# Patient Record
Sex: Female | Born: 1985 | Race: Black or African American | Hispanic: No | Marital: Single | Smoking: Never smoker
Health system: Southern US, Community
[De-identification: ages and names within clinical notes are randomized; demographics above are authoritative.]

## PROBLEM LIST (undated history)

## (undated) DIAGNOSIS — C8590 Non-Hodgkin lymphoma, unspecified, unspecified site: Secondary | ICD-10-CM

## (undated) DIAGNOSIS — T7840XA Allergy, unspecified, initial encounter: Secondary | ICD-10-CM

## (undated) DIAGNOSIS — D573 Sickle-cell trait: Secondary | ICD-10-CM

## (undated) DIAGNOSIS — D649 Anemia, unspecified: Secondary | ICD-10-CM

## (undated) DIAGNOSIS — C801 Malignant (primary) neoplasm, unspecified: Secondary | ICD-10-CM

## (undated) DIAGNOSIS — C8190 Hodgkin lymphoma, unspecified, unspecified site: Secondary | ICD-10-CM

## (undated) DIAGNOSIS — E039 Hypothyroidism, unspecified: Secondary | ICD-10-CM

## (undated) DIAGNOSIS — T783XXA Angioneurotic edema, initial encounter: Secondary | ICD-10-CM

## (undated) DIAGNOSIS — I10 Essential (primary) hypertension: Secondary | ICD-10-CM

## (undated) HISTORY — DX: Non-Hodgkin lymphoma, unspecified, unspecified site: C85.90

## (undated) HISTORY — DX: Anemia, unspecified: D64.9

## (undated) HISTORY — PX: BREAST REDUCTION SURGERY: SHX8

## (undated) HISTORY — DX: Malignant (primary) neoplasm, unspecified: C80.1

## (undated) HISTORY — PX: COSMETIC SURGERY: SHX468

## (undated) HISTORY — DX: Angioneurotic edema, initial encounter: T78.3XXA

## (undated) HISTORY — DX: Allergy, unspecified, initial encounter: T78.40XA

## (undated) HISTORY — DX: Hypothyroidism, unspecified: E03.9

## (undated) HISTORY — PX: REDUCTION MAMMAPLASTY: SUR839

---

## 2003-03-16 HISTORY — PX: LYMPH NODE BIOPSY: SHX201

## 2004-02-12 ENCOUNTER — Emergency Department (HOSPITAL_COMMUNITY): Admission: EM | Admit: 2004-02-12 | Discharge: 2004-02-12 | Payer: Self-pay | Admitting: Emergency Medicine

## 2004-02-25 ENCOUNTER — Encounter: Admission: RE | Admit: 2004-02-25 | Discharge: 2004-02-25 | Payer: Self-pay | Admitting: Otolaryngology

## 2004-03-06 ENCOUNTER — Ambulatory Visit (HOSPITAL_COMMUNITY): Admission: RE | Admit: 2004-03-06 | Discharge: 2004-03-06 | Payer: Self-pay | Admitting: Otolaryngology

## 2004-03-06 ENCOUNTER — Ambulatory Visit (HOSPITAL_BASED_OUTPATIENT_CLINIC_OR_DEPARTMENT_OTHER): Admission: RE | Admit: 2004-03-06 | Discharge: 2004-03-06 | Payer: Self-pay | Admitting: Otolaryngology

## 2004-03-06 ENCOUNTER — Encounter (INDEPENDENT_AMBULATORY_CARE_PROVIDER_SITE_OTHER): Payer: Self-pay | Admitting: *Deleted

## 2004-03-06 ENCOUNTER — Encounter (INDEPENDENT_AMBULATORY_CARE_PROVIDER_SITE_OTHER): Payer: Self-pay | Admitting: Otolaryngology

## 2004-03-13 ENCOUNTER — Ambulatory Visit: Payer: Self-pay | Admitting: Oncology

## 2004-03-15 DIAGNOSIS — C8190 Hodgkin lymphoma, unspecified, unspecified site: Secondary | ICD-10-CM

## 2004-03-15 HISTORY — DX: Hodgkin lymphoma, unspecified, unspecified site: C81.90

## 2004-03-15 HISTORY — PX: PORTACATH PLACEMENT: SHX2246

## 2004-03-20 ENCOUNTER — Ambulatory Visit (HOSPITAL_COMMUNITY): Admission: RE | Admit: 2004-03-20 | Discharge: 2004-03-20 | Payer: Self-pay | Admitting: Oncology

## 2004-03-24 ENCOUNTER — Ambulatory Visit: Admission: RE | Admit: 2004-03-24 | Discharge: 2004-03-24 | Payer: Self-pay | Admitting: Oncology

## 2004-03-24 ENCOUNTER — Encounter (INDEPENDENT_AMBULATORY_CARE_PROVIDER_SITE_OTHER): Payer: Self-pay | Admitting: Cardiology

## 2004-03-31 ENCOUNTER — Ambulatory Visit (HOSPITAL_COMMUNITY): Admission: RE | Admit: 2004-03-31 | Discharge: 2004-03-31 | Payer: Self-pay | Admitting: Oncology

## 2004-04-07 ENCOUNTER — Ambulatory Visit: Admission: RE | Admit: 2004-04-07 | Discharge: 2004-05-18 | Payer: Self-pay | Admitting: *Deleted

## 2004-04-30 ENCOUNTER — Ambulatory Visit: Payer: Self-pay | Admitting: Oncology

## 2004-06-16 ENCOUNTER — Ambulatory Visit (HOSPITAL_COMMUNITY): Admission: RE | Admit: 2004-06-16 | Discharge: 2004-06-16 | Payer: Self-pay | Admitting: Oncology

## 2004-06-17 ENCOUNTER — Ambulatory Visit: Payer: Self-pay | Admitting: Oncology

## 2004-07-17 ENCOUNTER — Ambulatory Visit: Payer: Self-pay | Admitting: Oncology

## 2004-07-21 ENCOUNTER — Ambulatory Visit: Admission: RE | Admit: 2004-07-21 | Discharge: 2004-10-09 | Payer: Self-pay | Admitting: *Deleted

## 2004-08-27 ENCOUNTER — Ambulatory Visit: Payer: Self-pay | Admitting: Oncology

## 2004-09-16 ENCOUNTER — Ambulatory Visit: Payer: Self-pay | Admitting: Oncology

## 2004-09-21 ENCOUNTER — Ambulatory Visit (HOSPITAL_COMMUNITY): Admission: RE | Admit: 2004-09-21 | Discharge: 2004-09-21 | Payer: Self-pay | Admitting: Oncology

## 2004-11-06 ENCOUNTER — Ambulatory Visit: Payer: Self-pay | Admitting: Oncology

## 2004-11-09 ENCOUNTER — Ambulatory Visit (HOSPITAL_COMMUNITY): Admission: RE | Admit: 2004-11-09 | Discharge: 2004-11-09 | Payer: Self-pay | Admitting: Oncology

## 2005-02-23 ENCOUNTER — Ambulatory Visit: Payer: Self-pay | Admitting: Oncology

## 2005-03-01 ENCOUNTER — Ambulatory Visit (HOSPITAL_COMMUNITY): Admission: RE | Admit: 2005-03-01 | Discharge: 2005-03-01 | Payer: Self-pay | Admitting: Oncology

## 2005-05-31 ENCOUNTER — Ambulatory Visit: Payer: Self-pay | Admitting: Oncology

## 2005-08-17 ENCOUNTER — Ambulatory Visit (HOSPITAL_COMMUNITY): Admission: RE | Admit: 2005-08-17 | Discharge: 2005-08-17 | Payer: Self-pay | Admitting: Oncology

## 2005-08-23 ENCOUNTER — Ambulatory Visit (HOSPITAL_COMMUNITY): Admission: RE | Admit: 2005-08-23 | Discharge: 2005-08-23 | Payer: Self-pay | Admitting: Oncology

## 2005-08-24 ENCOUNTER — Ambulatory Visit: Payer: Self-pay | Admitting: Oncology

## 2005-08-25 LAB — COMPREHENSIVE METABOLIC PANEL
AST: 19 U/L (ref 0–37)
Albumin: 3.8 g/dL (ref 3.5–5.2)
Alkaline Phosphatase: 51 U/L (ref 39–117)
BUN: 7 mg/dL (ref 6–23)
Calcium: 9.7 mg/dL (ref 8.4–10.5)
Chloride: 104 mEq/L (ref 96–112)
Glucose, Bld: 108 mg/dL — ABNORMAL HIGH (ref 70–99)
Potassium: 3.6 mEq/L (ref 3.5–5.3)
Sodium: 138 mEq/L (ref 135–145)
Total Protein: 7.2 g/dL (ref 6.0–8.3)

## 2005-08-25 LAB — CBC WITH DIFFERENTIAL (CANCER CENTER ONLY)
BASO%: 0.7 % (ref 0.0–2.0)
EOS%: 6.8 % (ref 0.0–7.0)
HCT: 36.3 % (ref 34.8–46.6)
LYMPH%: 33.1 % (ref 14.0–48.0)
MCH: 25.2 pg — ABNORMAL LOW (ref 26.0–34.0)
MCHC: 31.7 g/dL — ABNORMAL LOW (ref 32.0–36.0)
MCV: 80 fL — ABNORMAL LOW (ref 81–101)
MONO%: 5.7 % (ref 0.0–13.0)
NEUT%: 53.7 % (ref 39.6–80.0)
RDW: 13.3 % (ref 10.5–14.6)

## 2005-08-25 LAB — SEDIMENTATION RATE: Sed Rate: 11 mm/hr (ref 0–22)

## 2005-11-11 ENCOUNTER — Ambulatory Visit: Payer: Self-pay | Admitting: Oncology

## 2005-12-23 ENCOUNTER — Inpatient Hospital Stay (HOSPITAL_COMMUNITY): Admission: AD | Admit: 2005-12-23 | Discharge: 2005-12-23 | Payer: Self-pay | Admitting: Obstetrics and Gynecology

## 2005-12-24 ENCOUNTER — Ambulatory Visit: Payer: Self-pay | Admitting: Obstetrics & Gynecology

## 2005-12-24 ENCOUNTER — Encounter (INDEPENDENT_AMBULATORY_CARE_PROVIDER_SITE_OTHER): Payer: Self-pay | Admitting: Specialist

## 2005-12-24 ENCOUNTER — Ambulatory Visit (HOSPITAL_COMMUNITY): Admission: RE | Admit: 2005-12-24 | Discharge: 2005-12-24 | Payer: Self-pay | Admitting: Obstetrics & Gynecology

## 2006-01-07 ENCOUNTER — Ambulatory Visit: Payer: Self-pay | Admitting: Gynecology

## 2006-02-01 ENCOUNTER — Emergency Department (HOSPITAL_COMMUNITY): Admission: EM | Admit: 2006-02-01 | Discharge: 2006-02-01 | Payer: Self-pay | Admitting: Emergency Medicine

## 2006-02-22 ENCOUNTER — Ambulatory Visit (HOSPITAL_COMMUNITY): Admission: RE | Admit: 2006-02-22 | Discharge: 2006-02-22 | Payer: Self-pay | Admitting: Oncology

## 2006-03-02 ENCOUNTER — Ambulatory Visit: Payer: Self-pay | Admitting: Oncology

## 2006-03-03 LAB — CBC WITH DIFFERENTIAL (CANCER CENTER ONLY)
Eosinophils Absolute: 0.1 10*3/uL (ref 0.0–0.5)
HCT: 34.8 % (ref 34.8–46.6)
LYMPH%: 35.4 % (ref 14.0–48.0)
MCH: 26.6 pg (ref 26.0–34.0)
MCV: 81 fL (ref 81–101)
MONO#: 0.3 10*3/uL (ref 0.1–0.9)
NEUT%: 53.6 % (ref 39.6–80.0)
RBC: 4.29 10*6/uL (ref 3.70–5.32)
WBC: 4 10*3/uL (ref 3.9–10.0)

## 2006-03-03 LAB — COMPREHENSIVE METABOLIC PANEL
AST: 20 U/L (ref 0–37)
Alkaline Phosphatase: 48 U/L (ref 39–117)
BUN: 6 mg/dL (ref 6–23)
Creatinine, Ser: 0.6 mg/dL (ref 0.40–1.20)
Glucose, Bld: 86 mg/dL (ref 70–99)

## 2006-04-15 ENCOUNTER — Ambulatory Visit: Payer: Self-pay | Admitting: Oncology

## 2006-04-18 ENCOUNTER — Encounter (INDEPENDENT_AMBULATORY_CARE_PROVIDER_SITE_OTHER): Payer: Self-pay | Admitting: *Deleted

## 2006-04-18 ENCOUNTER — Ambulatory Visit (HOSPITAL_COMMUNITY): Admission: RE | Admit: 2006-04-18 | Discharge: 2006-04-18 | Payer: Self-pay | Admitting: General Surgery

## 2006-04-19 LAB — CBC WITH DIFFERENTIAL (CANCER CENTER ONLY)
BASO%: 0.3 % (ref 0.0–2.0)
EOS%: 2.9 % (ref 0.0–7.0)
HCT: 34.8 % (ref 34.8–46.6)
LYMPH#: 1.3 10*3/uL (ref 0.9–3.3)
MCHC: 33 g/dL (ref 32.0–36.0)
NEUT#: 3.1 10*3/uL (ref 1.5–6.5)
NEUT%: 62.2 % (ref 39.6–80.0)
RDW: 13.4 % (ref 10.5–14.6)

## 2006-10-14 ENCOUNTER — Ambulatory Visit: Payer: Self-pay | Admitting: Oncology

## 2006-10-20 ENCOUNTER — Ambulatory Visit (HOSPITAL_COMMUNITY): Admission: RE | Admit: 2006-10-20 | Discharge: 2006-10-20 | Payer: Self-pay | Admitting: Oncology

## 2006-10-20 LAB — COMPREHENSIVE METABOLIC PANEL
Alkaline Phosphatase: 49 U/L (ref 39–117)
CO2: 25 mEq/L (ref 19–32)
Creatinine, Ser: 0.71 mg/dL (ref 0.40–1.20)
Glucose, Bld: 120 mg/dL — ABNORMAL HIGH (ref 70–99)
Sodium: 139 mEq/L (ref 135–145)
Total Bilirubin: 0.4 mg/dL (ref 0.3–1.2)
Total Protein: 7.1 g/dL (ref 6.0–8.3)

## 2006-10-20 LAB — CBC WITH DIFFERENTIAL (CANCER CENTER ONLY)
BASO#: 0 10*3/uL (ref 0.0–0.2)
BASO%: 0.3 % (ref 0.0–2.0)
EOS%: 3.8 % (ref 0.0–7.0)
HGB: 11.2 g/dL — ABNORMAL LOW (ref 11.6–15.9)
LYMPH#: 1.9 10*3/uL (ref 0.9–3.3)
MCHC: 32.7 g/dL (ref 32.0–36.0)
NEUT#: 2.1 10*3/uL (ref 1.5–6.5)
RDW: 14.3 % (ref 10.5–14.6)

## 2006-10-20 LAB — SEDIMENTATION RATE: Sed Rate: 8 mm/hr (ref 0–22)

## 2006-10-26 LAB — IRON AND TIBC
%SAT: 21 % (ref 20–55)
Iron: 79 ug/dL (ref 42–145)
TIBC: 368 ug/dL (ref 250–470)

## 2006-10-26 LAB — FERRITIN: Ferritin: 7 ng/mL — ABNORMAL LOW (ref 10–291)

## 2007-02-22 ENCOUNTER — Inpatient Hospital Stay (HOSPITAL_COMMUNITY): Admission: AD | Admit: 2007-02-22 | Discharge: 2007-02-23 | Payer: Self-pay | Admitting: Obstetrics and Gynecology

## 2007-03-23 ENCOUNTER — Ambulatory Visit (HOSPITAL_COMMUNITY): Admission: RE | Admit: 2007-03-23 | Discharge: 2007-03-23 | Payer: Self-pay | Admitting: Family Medicine

## 2007-04-20 ENCOUNTER — Ambulatory Visit (HOSPITAL_COMMUNITY): Admission: RE | Admit: 2007-04-20 | Discharge: 2007-04-20 | Payer: Self-pay | Admitting: Family Medicine

## 2007-04-24 ENCOUNTER — Ambulatory Visit: Payer: Self-pay | Admitting: Oncology

## 2007-05-04 ENCOUNTER — Ambulatory Visit (HOSPITAL_COMMUNITY): Admission: RE | Admit: 2007-05-04 | Discharge: 2007-05-04 | Payer: Self-pay | Admitting: Family Medicine

## 2007-06-01 ENCOUNTER — Ambulatory Visit (HOSPITAL_COMMUNITY): Admission: RE | Admit: 2007-06-01 | Discharge: 2007-06-01 | Payer: Self-pay | Admitting: Family Medicine

## 2007-06-29 ENCOUNTER — Ambulatory Visit (HOSPITAL_COMMUNITY): Admission: RE | Admit: 2007-06-29 | Discharge: 2007-06-29 | Payer: Self-pay | Admitting: Family Medicine

## 2007-07-24 ENCOUNTER — Inpatient Hospital Stay (HOSPITAL_COMMUNITY): Admission: AD | Admit: 2007-07-24 | Discharge: 2007-07-24 | Payer: Self-pay | Admitting: Family Medicine

## 2007-08-09 ENCOUNTER — Encounter: Admission: RE | Admit: 2007-08-09 | Discharge: 2007-08-09 | Payer: Self-pay | Admitting: Obstetrics & Gynecology

## 2007-08-10 ENCOUNTER — Ambulatory Visit (HOSPITAL_COMMUNITY): Admission: RE | Admit: 2007-08-10 | Discharge: 2007-08-10 | Payer: Self-pay | Admitting: Family Medicine

## 2007-09-07 ENCOUNTER — Ambulatory Visit (HOSPITAL_COMMUNITY): Admission: RE | Admit: 2007-09-07 | Discharge: 2007-09-07 | Payer: Self-pay | Admitting: Family Medicine

## 2007-09-29 ENCOUNTER — Inpatient Hospital Stay (HOSPITAL_COMMUNITY): Admission: AD | Admit: 2007-09-29 | Discharge: 2007-10-02 | Payer: Self-pay | Admitting: Obstetrics & Gynecology

## 2007-10-19 ENCOUNTER — Ambulatory Visit: Payer: Self-pay | Admitting: Oncology

## 2007-11-14 LAB — CBC WITH DIFFERENTIAL (CANCER CENTER ONLY)
BASO#: 0 10*3/uL (ref 0.0–0.2)
Eosinophils Absolute: 0.2 10*3/uL (ref 0.0–0.5)
HGB: 12 g/dL (ref 11.6–15.9)
LYMPH%: 40.1 % (ref 14.0–48.0)
MCV: 75 fL — ABNORMAL LOW (ref 81–101)
MONO#: 0.3 10*3/uL (ref 0.1–0.9)
NEUT#: 1.6 10*3/uL (ref 1.5–6.5)
Platelets: 257 10*3/uL (ref 145–400)
RBC: 4.91 10*6/uL (ref 3.70–5.32)
WBC: 3.5 10*3/uL — ABNORMAL LOW (ref 3.9–10.0)

## 2007-11-14 LAB — COMPREHENSIVE METABOLIC PANEL
ALT: 27 U/L (ref 0–35)
BUN: 9 mg/dL (ref 6–23)
CO2: 24 mEq/L (ref 19–32)
Calcium: 9.2 mg/dL (ref 8.4–10.5)
Creatinine, Ser: 0.7 mg/dL (ref 0.40–1.20)
Glucose, Bld: 88 mg/dL (ref 70–99)
Total Bilirubin: 0.4 mg/dL (ref 0.3–1.2)

## 2007-11-14 LAB — IRON AND TIBC
%SAT: 13 % — ABNORMAL LOW (ref 20–55)
Iron: 48 ug/dL (ref 42–145)
TIBC: 378 ug/dL (ref 250–470)
UIBC: 330 ug/dL

## 2007-11-14 LAB — SEDIMENTATION RATE: Sed Rate: 6 mm/hr (ref 0–22)

## 2008-05-03 ENCOUNTER — Ambulatory Visit: Payer: Self-pay | Admitting: Oncology

## 2008-05-08 ENCOUNTER — Ambulatory Visit (HOSPITAL_COMMUNITY): Admission: RE | Admit: 2008-05-08 | Discharge: 2008-05-08 | Payer: Self-pay | Admitting: Oncology

## 2008-05-13 LAB — CMP (CANCER CENTER ONLY)
ALT(SGPT): 17 U/L (ref 10–47)
AST: 24 U/L (ref 11–38)
CO2: 28 mEq/L (ref 18–33)
Sodium: 137 mEq/L (ref 128–145)
Total Bilirubin: 0.5 mg/dl (ref 0.20–1.60)
Total Protein: 7.7 g/dL (ref 6.4–8.1)

## 2008-05-13 LAB — CBC WITH DIFFERENTIAL (CANCER CENTER ONLY)
BASO#: 0 10*3/uL (ref 0.0–0.2)
Eosinophils Absolute: 0.2 10*3/uL (ref 0.0–0.5)
HGB: 11.9 g/dL (ref 11.6–15.9)
LYMPH#: 1.5 10*3/uL (ref 0.9–3.3)
MCH: 25.4 pg — ABNORMAL LOW (ref 26.0–34.0)
MONO#: 0.3 10*3/uL (ref 0.1–0.9)
MONO%: 8.3 % (ref 0.0–13.0)
NEUT#: 1.3 10*3/uL — ABNORMAL LOW (ref 1.5–6.5)
Platelets: 237 10*3/uL (ref 145–400)
RBC: 4.67 10*6/uL (ref 3.70–5.32)
WBC: 3.4 10*3/uL — ABNORMAL LOW (ref 3.9–10.0)

## 2008-05-14 LAB — SEDIMENTATION RATE: Sed Rate: 9 mm/hr (ref 0–22)

## 2008-11-05 ENCOUNTER — Ambulatory Visit: Payer: Self-pay | Admitting: Oncology

## 2008-11-08 LAB — CBC WITH DIFFERENTIAL (CANCER CENTER ONLY)
BASO%: 0.7 % (ref 0.0–2.0)
EOS%: 7.7 % — ABNORMAL HIGH (ref 0.0–7.0)
HGB: 12.2 g/dL (ref 11.6–15.9)
LYMPH#: 1.6 10*3/uL (ref 0.9–3.3)
MCH: 26.2 pg (ref 26.0–34.0)
MCHC: 33.6 g/dL (ref 32.0–36.0)
MONO%: 7.4 % (ref 0.0–13.0)
NEUT#: 1.5 10*3/uL (ref 1.5–6.5)
Platelets: 231 10*3/uL (ref 145–400)
RDW: 13.7 % (ref 10.5–14.6)

## 2008-11-08 LAB — CMP (CANCER CENTER ONLY)
CO2: 29 mEq/L (ref 18–33)
Calcium: 8.9 mg/dL (ref 8.0–10.3)
Creat: 0.7 mg/dl (ref 0.6–1.2)
Glucose, Bld: 94 mg/dL (ref 73–118)
Sodium: 142 mEq/L (ref 128–145)
Total Bilirubin: 0.5 mg/dl (ref 0.20–1.60)
Total Protein: 7.9 g/dL (ref 6.4–8.1)

## 2008-11-08 LAB — SEDIMENTATION RATE: Sed Rate: 8 mm/hr (ref 0–22)

## 2009-04-02 ENCOUNTER — Emergency Department (HOSPITAL_COMMUNITY): Admission: EM | Admit: 2009-04-02 | Discharge: 2009-04-02 | Payer: Self-pay | Admitting: Emergency Medicine

## 2009-05-09 ENCOUNTER — Ambulatory Visit: Payer: Self-pay | Admitting: Oncology

## 2009-05-13 LAB — CMP (CANCER CENTER ONLY)
ALT(SGPT): 26 U/L (ref 10–47)
Alkaline Phosphatase: 58 U/L (ref 26–84)
Sodium: 137 mEq/L (ref 128–145)
Total Bilirubin: 0.6 mg/dl (ref 0.20–1.60)
Total Protein: 7.4 g/dL (ref 6.4–8.1)

## 2009-05-13 LAB — CBC WITH DIFFERENTIAL (CANCER CENTER ONLY)
BASO%: 0.3 % (ref 0.0–2.0)
LYMPH%: 41.9 % (ref 14.0–48.0)
MCH: 25.1 pg — ABNORMAL LOW (ref 26.0–34.0)
MCV: 77 fL — ABNORMAL LOW (ref 81–101)
MONO%: 6.5 % (ref 0.0–13.0)
Platelets: 236 10*3/uL (ref 145–400)
RDW: 13.8 % (ref 10.5–14.6)

## 2010-04-05 ENCOUNTER — Encounter: Payer: Self-pay | Admitting: Oncology

## 2010-07-31 NOTE — Op Note (Signed)
[name], Katelyn Cobb               ACCOUNT NO.:  [id number]   MEDICAL RECORD NO.:  [id number]          PATIENT TYPE:  AMB   LOCATION:  DAY                          FACILITY:  [facility]   PHYSICIAN:  Leonie Man, M.D.   DATE OF BIRTH:  10/15/1985   DATE OF PROCEDURE:  04/18/2006  DATE OF DISCHARGE:                               OPERATIVE REPORT   PREOPERATIVE DIAGNOSIS:  Left inguinal adenopathy.   POSTOPERATIVE DIAGNOSIS:  Left inguinal adenopathy.   PROCEDURE:  Is excisional biopsy of left inguinal lymph node.   SURGEON:  Dr. Lurene Shadow   ASSISTANT:  OR nurse.   ANESTHESIA:  General.   HISTORY:  Katelyn Cobb is a 25 year old female status post treatment for  Hodgkin's disease in the past, now comes back with adenopathy and has a  large left-sided groin lymph node. The question is whether or not she  has recurrent Hodgkin's.  She comes to the operating room for excisional  biopsy of this lymph node after the risks and potential benefits of  surgery have been discussed.  All questions answered and consent  obtained.   PROCEDURE:  The patient positioned supinely and following satisfactory  sedation the groin is prepped and draped to be included in a sterile  operative field.  The region of the lymph node then is incised over and  deepened through the skin and subcutaneous tissue carrying the  dissection down to the lymph node which was at the edge of the inguinal  canal. This was dissected free on all sides, removed in it's entirety  and forwarded for pathologic evaluation.  Hemostasis assured with  electrocautery.  Sponge and instrument counts verified.  Subcutaneous  tissues closed with 3-0 Vicryl sutures.  Skin closed with 4-0 Monocryl  suture and reinforced with Steri-Strips.  A sterile dressing applied.  Anesthetic reversed.  The patient removed from the operating room to the  recovery room in stable condition.  She tolerated the procedure well.      Leonie Man, M.D.  Electronically Signed     PB/MEDQ  D:  04/18/2006  T:  04/18/2006  Job:  [id number]   cc:   Drue Second, MD  Fax: [phone]

## 2010-07-31 NOTE — Op Note (Signed)
NAMEJILENE, SPOHR               ACCOUNT NO.:  [id number]   MEDICAL RECORD NO.:  [id number]          PATIENT TYPE:  AMB   LOCATION:  DSC                          FACILITY:  [facility]   PHYSICIAN:  Christopher E. Ezzard Standing, M.D.DATE OF BIRTH:  01-20-1986   DATE OF PROCEDURE:  03/06/2004  DATE OF DISCHARGE:                                 OPERATIVE REPORT   PREOPERATIVE DIAGNOSIS:  Multiple left neck lymphadenopathy.   POSTOPERATIVE DIAGNOSES:  1.  Multiple left neck lymphadenopathy.  2.  Rule out lymphoma.   OPERATION:  Excisional biopsy of deep left neck lymph nodes x2.   SURGEON:  Kristine Garbe. Ezzard Standing, M.D.   ANESTHESIA:  General endotracheal.   COMPLICATIONS:  None.   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  On examination she has several  large left lower neck lymph nodes.  A CT scan showed fairly extensive  lymphadenopathy extending down to the upper mediastinum and predominantly  more on the left side.  She was taken to the operating room at this time for  excisional biopsy of a left neck lymph node.   DESCRIPTION OF PROCEDURE:  After general endotracheal anesthesia, the  patient received 1 g Ancef IV preoperatively.  The left neck was prepped and  draped Betadine solution and draped out with sterile towels.  A 3-4 cm  incision was made horizontally just lateral to the sternocleidomastoid  muscle just above the clavicle on the left lower neck.  Dissection was  carried down deep to the platysma muscle down into the supraclavicular fat  pad and lymph nodes.  She had multiple moderate-sized lymph nodes, 1-2 cm  size.  She had some larger nodes a little bit more medially and inferior,  but these would be much more difficult to remove.  The first node removed  was sent fresh for frozen section.  Frozen section report was consistent  with possible lymphoma, and the pathologist requested more tissue.  A  second  lymph node measuring approximately the same size, approximately 2 cm in  size, was removed along with a few small less than 1 cm lymph nodes.  The  specimen was sent in saline fresh to pathology.  Hemostasis was obtained  with 4-0 chromic ligatures and cautery.  The defect was closed with 4-0  chromic suture subcutaneously and reapproximate the platysma muscle, and a 5-  0 nylon subcuticular stitch followed by Steri-Strips.  Kayton was awoken  from anesthesia and transferred to recovery postop doing well.   DISPOSITION:  Raffaela is discharged home later this morning on Tylenol and  Tylenol No. 3 p.r.n. pain.  We will have her follow up in my office in six  days to have sutures removed and review pathology.       CEN/MEDQ  D:  03/06/2004  T:  03/07/2004  Job:  [id number]   cc:   Wonda Cheng, M.D.  [facility]  [address]  [place], [place] [address]

## 2010-07-31 NOTE — Op Note (Signed)
Katelyn Cobb, Katelyn Cobb               ACCOUNT NO.:  [id number]   MEDICAL RECORD NO.:  [id number]          PATIENT TYPE:  AMB   LOCATION:  SDC                           FACILITY:  [facility]   PHYSICIAN:  Lesly Dukes, M.D. DATE OF BIRTH:  10-10-1985   DATE OF PROCEDURE:  12/24/2005  DATE OF DISCHARGE:  12/24/2005                                 OPERATIVE REPORT   PREOPERATIVE DIAGNOSIS:  A 25 year old, gravida 1, para 0, at 8 weeks 1 day  missed abortion.   POSTOPERATIVE DIAGNOSIS:  A 25 year old, gravida 1, para 0, at 8 weeks 1 day  missed abortion.   PROCEDURE:  D&C and repair of cervical laceration.   SURGEON:  Lesly Dukes, MD.   ASSISTANT:  Paticia Stack, MD.   ANESTHESIA:  MAC and local.   SPECIMEN:  Products of conception.   ESTIMATED BLOOD LOSS:  Minimal.   COMPLICATIONS:  None.   FINDINGS:  Eight-week size anteverted uterus, moderate amount of products of  conception.   PROCEDURE:  The patient taken to the operating room and placed under MAC  anesthesia.  The patient prepped and draped in a sterile fashion.  A sterile  speculum was placed in patient's vagina, and cervix noted to be 1 cm  dilated.  The cervix and vagina were then swabbed with Betadine, and 10 ml  of  Xylocaine were injected at the 3 and 9 o'clock positions for a  paracervical block.  The uterus was sounded and progressively dilated with  serial dilators, an 8 mm suction curette advanced gently to the uterine  fundus.  The suction device was then activated and then the curette repeated  to clear the uterus of the products of conception.  A sharp curettage was  then performed until a gritty texture was noted.  The suction curette was  then reintroduced to clear the uterus of all remaining products of  conception.  There was minimal bleeding noted.  The tenaculum was clamped  off the cervix, and patient had a 1 cm cervical laceration that was repaired  with a #0 chromic with good hemostasis.  The  patient tolerated the procedure  well and was taken to the recovery area in stable condition.  Products of  conception sent to Pathology.    ______________________________  Paticia Stack, MD    ______________________________  Lesly Dukes, M.D.   LNJ/MEDQ  D:  12/24/2005  T:  12/26/2005  Job:  [id number]

## 2010-09-09 LAB — HEPATITIS B SURFACE ANTIGEN: Hepatitis B Surface Ag: NEGATIVE

## 2010-09-09 LAB — ANTIBODY SCREEN: Antibody Screen: NEGATIVE

## 2010-09-22 LAB — GC/CHLAMYDIA PROBE AMP, GENITAL: Gonorrhea: NEGATIVE

## 2010-09-24 ENCOUNTER — Other Ambulatory Visit (HOSPITAL_COMMUNITY): Payer: Self-pay | Admitting: Obstetrics and Gynecology

## 2010-09-24 DIAGNOSIS — Z139 Encounter for screening, unspecified: Secondary | ICD-10-CM

## 2010-10-06 ENCOUNTER — Ambulatory Visit (HOSPITAL_COMMUNITY)
Admission: RE | Admit: 2010-10-06 | Discharge: 2010-10-06 | Disposition: A | Discharge status: Home / Self Care | Payer: PRIVATE HEALTH INSURANCE | Source: Ambulatory Visit | Attending: Obstetrics and Gynecology | Admitting: Obstetrics and Gynecology

## 2010-10-06 DIAGNOSIS — O3510X0 Maternal care for (suspected) chromosomal abnormality in fetus, unspecified, not applicable or unspecified: Secondary | ICD-10-CM | POA: Insufficient documentation

## 2010-10-06 DIAGNOSIS — Z139 Encounter for screening, unspecified: Secondary | ICD-10-CM

## 2010-10-06 DIAGNOSIS — Z148 Genetic carrier of other disease: Secondary | ICD-10-CM | POA: Insufficient documentation

## 2010-10-06 DIAGNOSIS — O351XX0 Maternal care for (suspected) chromosomal abnormality in fetus, not applicable or unspecified: Secondary | ICD-10-CM | POA: Insufficient documentation

## 2010-10-06 DIAGNOSIS — D573 Sickle-cell trait: Secondary | ICD-10-CM | POA: Insufficient documentation

## 2010-10-06 DIAGNOSIS — Z3689 Encounter for other specified antenatal screening: Secondary | ICD-10-CM | POA: Insufficient documentation

## 2010-10-06 NOTE — Progress Notes (Signed)
Genetic Counseling  High-Risk Gestation Note  Appointment Date:  10/06/2010 Referred By: Jeani Hawking, MD Date of Birth:  1986/01/15 Partner:  Katelyn Cobb  I met with Ms. Katelyn Cobb and her partner, Katelyn Cobb for prenatal genetic counseling given that she has sickle cell trait and to discuss first trimester screening.    Both family histories were reviewed and found to be contributory  for the patient and her mom having sickle cell trait. The father of the pregnancy reported that he has an appointment on Thursday morning with [facility] [place] for sickle cell screening. He has not previously been screened. We discussed sickle cell anemia (SCA) including: the features of SCA, autosomal recessive inheritance, the approximate 1 in 10 carrier frequency in the [place] American population, and the availability of carrier testing.  We reviewed that when both parents are carriers of sickle cell trait or hemoglobin variant, each pregnancy has a 1 in 4 (25%) chance to inherit sickle cell disease. If one parent is a carrier and the other is not a carrier, the pregnancy would not be at risk for sickle cell disease, but would have a 1 in 2 (50%) chance to also inherit sickle cell trait. When both parents are identified as carriers, prenatal diagnosis for sickle cell disease would be available via amniocentesis, if desired. Additionally, sickle cell is included on the [place] newborn screening panel. The couple may contact us should that have questions regarding risk assessment once the father of the pregnancy has his sickle cell screening results.   Additionally, the patient reported a niece born with one extra finger that was removed after birth. Her maternal grandfather also reportedly was born with an extra finger. Polydactyly is typically an isolated trait that can occur sporadically in a person or inherited as a family trait. When inherited as a family trait, autosomal dominant  inheritance with reduced penetrance is observed. Less commonly, polydactyly may be one feature of an underlying genetic condition that may or may not be inherited. Given the reported family history, recurrence risk for the current pregnancy is likely low. Targeted ultrasound is available to assess for polydactyly. However, ultrasound cannot rule out all birth defects antenatally.    They were counseled regarding screening options including: First Screen, Quad Screen, Integrated Screen, and detailed ultrasound. We discussed that screening modifies a person's baseline risk to provide a pregnancy specific risk assessment for chromosome conditions and open neural tube defects (in the case of Quad or Integrated screen). These screens are not diagnostic, nor do they screen for all chromosome condition or birth defects. The risks, benefits, and limitations of each of these options were reviewed in detail.  We reviewed chromosomes and nondisjunction. We discussed examples of chromosome conditions including Down syndrome (trisomy 72) and trisomy 35. Given the patient's age alone, the pregnancy is not considered at increased risk for fetal aneuploidy. After careful consideration, the patient elected to proceed with Integrated screen at the time of today's visit. She will return in 4 weeks for second blood draw to complete Integrated screening.  They understand that although the ultrasound may appear normal, the risk of anomalies cannot be completely eliminated.    She denied exposure to environmental toxins or chemical agents.  She denied the use of alcohol, tobacco or street drugs.  She denied significant viral illnesses during the course of her pregnancy.  Her medical and surgical history were contributory for diagnosis of Hodgkin's lymphoma in 2005. She reported that her  treatment ended in June 2006 and that she is in remission. She reported that she has annual follow-up with oncology.   A complete obstetrical  ultrasound was performed at the time of today's evaluation.  The ultrasound report is reported separately.    We counseled the patient and her partner for approximately 20 minutes regarding the above risks and available options.     Clydie Braun Lurlene Ronda, MS, [facility] 10/06/2010

## 2010-10-07 ENCOUNTER — Encounter (HOSPITAL_COMMUNITY): Payer: Self-pay

## 2010-11-03 ENCOUNTER — Other Ambulatory Visit: Payer: Self-pay | Admitting: Maternal and Fetal Medicine

## 2010-11-03 ENCOUNTER — Ambulatory Visit (HOSPITAL_COMMUNITY)
Admission: RE | Admit: 2010-11-03 | Discharge: 2010-11-03 | Disposition: A | Discharge status: Home / Self Care | Payer: PRIVATE HEALTH INSURANCE | Source: Ambulatory Visit | Attending: Obstetrics and Gynecology | Admitting: Obstetrics and Gynecology

## 2010-11-03 DIAGNOSIS — O3510X0 Maternal care for (suspected) chromosomal abnormality in fetus, unspecified, not applicable or unspecified: Secondary | ICD-10-CM | POA: Insufficient documentation

## 2010-11-03 DIAGNOSIS — O351XX0 Maternal care for (suspected) chromosomal abnormality in fetus, not applicable or unspecified: Secondary | ICD-10-CM | POA: Insufficient documentation

## 2010-11-23 ENCOUNTER — Other Ambulatory Visit: Payer: Self-pay | Admitting: Oncology

## 2010-11-23 ENCOUNTER — Encounter (HOSPITAL_BASED_OUTPATIENT_CLINIC_OR_DEPARTMENT_OTHER): Payer: PRIVATE HEALTH INSURANCE | Admitting: Oncology

## 2010-11-23 DIAGNOSIS — Z331 Pregnant state, incidental: Secondary | ICD-10-CM

## 2010-11-23 DIAGNOSIS — D72819 Decreased white blood cell count, unspecified: Secondary | ICD-10-CM

## 2010-11-23 DIAGNOSIS — C8119 Nodular sclerosis classical Hodgkin lymphoma, extranodal and solid organ sites: Secondary | ICD-10-CM

## 2010-11-23 DIAGNOSIS — D509 Iron deficiency anemia, unspecified: Secondary | ICD-10-CM

## 2010-11-23 DIAGNOSIS — D649 Anemia, unspecified: Secondary | ICD-10-CM

## 2010-11-23 LAB — CBC WITH DIFFERENTIAL/PLATELET
Basophils Absolute: 0 10*3/uL (ref 0.0–0.1)
Eosinophils Absolute: 0.2 10*3/uL (ref 0.0–0.5)
HGB: 10.2 g/dL — ABNORMAL LOW (ref 11.6–15.9)
MONO#: 0.6 10*3/uL (ref 0.1–0.9)
NEUT#: 3.1 10*3/uL (ref 1.5–6.5)
RDW: 13.5 % (ref 11.2–14.5)
WBC: 5.4 10*3/uL (ref 3.9–10.3)
lymph#: 1.5 10*3/uL (ref 0.9–3.3)

## 2010-11-23 LAB — COMPREHENSIVE METABOLIC PANEL
Alkaline Phosphatase: 44 U/L (ref 39–117)
BUN: 6 mg/dL (ref 6–23)
Creatinine, Ser: 0.51 mg/dL (ref 0.50–1.10)
Glucose, Bld: 67 mg/dL — ABNORMAL LOW (ref 70–99)
Sodium: 138 mEq/L (ref 135–145)
Total Bilirubin: 0.2 mg/dL — ABNORMAL LOW (ref 0.3–1.2)
Total Protein: 6.4 g/dL (ref 6.0–8.3)

## 2010-11-23 LAB — SEDIMENTATION RATE: Sed Rate: 18 mm/hr (ref 0–22)

## 2010-12-11 LAB — CBC
HCT: 32.4 — ABNORMAL LOW
Hemoglobin: 10.5 — ABNORMAL LOW
MCHC: 32.5
MCHC: 32.5
MCV: 75.8 — ABNORMAL LOW
Platelets: 217
RDW: 19.1 — ABNORMAL HIGH

## 2010-12-21 LAB — URINALYSIS, ROUTINE W REFLEX MICROSCOPIC
Bilirubin Urine: NEGATIVE
Protein, ur: NEGATIVE
Specific Gravity, Urine: >1.03 — ABNORMAL HIGH
Urobilinogen, UA: 0.2

## 2010-12-21 LAB — WET PREP, GENITAL: Yeast Wet Prep HPF POC: NONE SEEN

## 2010-12-21 LAB — CBC
HCT: 31.9 — ABNORMAL LOW
MCHC: 33.9
Platelets: 270
RDW: 15

## 2010-12-21 LAB — POCT PREGNANCY, URINE: Preg Test, Ur: POSITIVE

## 2010-12-21 LAB — GC/CHLAMYDIA PROBE AMP, GENITAL
Chlamydia, DNA Probe: NEGATIVE
GC Probe Amp, Genital: NEGATIVE

## 2010-12-21 LAB — URINE MICROSCOPIC-ADD ON: WBC, UA: NONE SEEN

## 2011-01-13 ENCOUNTER — Other Ambulatory Visit: Payer: Self-pay | Admitting: Oncology

## 2011-01-13 ENCOUNTER — Encounter: Payer: Self-pay | Admitting: Oncology

## 2011-01-13 DIAGNOSIS — C859A Non-Hodgkin lymphoma, unspecified, in remission: Secondary | ICD-10-CM

## 2011-01-13 DIAGNOSIS — C8590 Non-Hodgkin lymphoma, unspecified, unspecified site: Secondary | ICD-10-CM

## 2011-01-13 HISTORY — DX: Non-Hodgkin lymphoma, unspecified, unspecified site: C85.90

## 2011-01-13 HISTORY — DX: Non-Hodgkin lymphoma, unspecified, in remission: C85.9A

## 2011-01-22 ENCOUNTER — Ambulatory Visit (HOSPITAL_BASED_OUTPATIENT_CLINIC_OR_DEPARTMENT_OTHER): Payer: PRIVATE HEALTH INSURANCE | Admitting: Oncology

## 2011-01-22 ENCOUNTER — Other Ambulatory Visit (HOSPITAL_BASED_OUTPATIENT_CLINIC_OR_DEPARTMENT_OTHER): Payer: PRIVATE HEALTH INSURANCE | Admitting: Lab

## 2011-01-22 ENCOUNTER — Telehealth: Payer: Self-pay | Admitting: Oncology

## 2011-01-22 ENCOUNTER — Other Ambulatory Visit: Payer: Self-pay | Admitting: Oncology

## 2011-01-22 VITALS — BP 121/77 | HR 116 | Temp 98.8°F | Ht 62.5 in | Wt 160.7 lb

## 2011-01-22 DIAGNOSIS — C8590 Non-Hodgkin lymphoma, unspecified, unspecified site: Secondary | ICD-10-CM

## 2011-01-22 DIAGNOSIS — D500 Iron deficiency anemia secondary to blood loss (chronic): Secondary | ICD-10-CM | POA: Insufficient documentation

## 2011-01-22 DIAGNOSIS — D509 Iron deficiency anemia, unspecified: Secondary | ICD-10-CM

## 2011-01-22 DIAGNOSIS — C8190 Hodgkin lymphoma, unspecified, unspecified site: Secondary | ICD-10-CM

## 2011-01-22 LAB — CBC WITH DIFFERENTIAL/PLATELET
BASO%: 0.5 % (ref 0.0–2.0)
EOS%: 5.3 % (ref 0.0–7.0)
MCH: 26.8 pg (ref 25.1–34.0)
MCHC: 33.1 g/dL (ref 31.5–36.0)
MCV: 81.1 fL (ref 79.5–101.0)
MONO%: 7.9 % (ref 0.0–14.0)
NEUT%: 67.4 % (ref 38.4–76.8)
RDW: 14.1 % (ref 11.2–14.5)
lymph#: 1.2 10*3/uL (ref 0.9–3.3)

## 2011-01-22 LAB — IRON AND TIBC
TIBC: 460 ug/dL (ref 250–470)
UIBC: 318 ug/dL (ref 125–400)

## 2011-01-22 LAB — COMPREHENSIVE METABOLIC PANEL
ALT: 15 U/L (ref 0–35)
Albumin: 3.4 g/dL — ABNORMAL LOW (ref 3.5–5.2)
CO2: 23 mEq/L (ref 19–32)
Chloride: 105 mEq/L (ref 96–112)
Glucose, Bld: 97 mg/dL (ref 70–99)
Potassium: 3.6 mEq/L (ref 3.5–5.3)
Sodium: 136 mEq/L (ref 135–145)
Total Protein: 6.2 g/dL (ref 6.0–8.3)

## 2011-01-22 LAB — SEDIMENTATION RATE: Sed Rate: 24 mm/hr — ABNORMAL HIGH (ref 0–22)

## 2011-01-22 LAB — LACTATE DEHYDROGENASE: LDH: 186 U/L (ref 94–250)

## 2011-01-22 NOTE — Progress Notes (Signed)
Note dictated

## 2011-01-22 NOTE — Telephone Encounter (Signed)
Gv pt appt for [id number] 

## 2011-01-23 ENCOUNTER — Other Ambulatory Visit: Payer: Self-pay | Admitting: Oncology

## 2011-01-25 NOTE — Progress Notes (Signed)
CC:   Katelyn Cobb. Ezzard Standing, M.D. Freddy Finner, M.D.  DIAGNOSES: 80. 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 2. The patient also has iron deficiency anemia. 3. The patient is currently [redacted] weeks pregnant.  PAST THERAPY: 1. Status post 4 cycles of ABVD chemotherapy from 04/10/2004 to     07/17/2004. 2. Mantle field radiation therapy administered from 08/12/2004 to     09/04/2004. 3. The patient is on oral iron for iron deficiency anemia.  INTERVAL HISTORY:  The patient is seen in followup today.  Overall she seems to be doing quite well without any significant problems.  However, she is tired and fatigued.  She is [redacted] weeks gestation.  She has no nausea, no vomiting.  No fevers, no chills, and remainder of the 10 point review of systems is negative.  PHYSICAL EXAMINATION:  General:  The patient is awake, alert, in no acute distress.  She appears well.  Vital signs:  Temperature is 98.8, pulse 116, respirations 20, blood pressure 121/77 and weight is 160 pounds.  HEENT:  EOMI.  PERRLA.  Sclerae anicteric.  No conjunctival pallor.  Oral mucosa is moist.  Neck:  Supple.  Lungs:  Are clear to auscultation and percussion.  Cardiovascular:  Regular rate and rhythm. No murmurs, gallops or rubs.  Abdomen:  Soft, nontender, nondistended. Bowel sounds are present.  No HSM.  Extremities:  No edema.  Neuro:  Are nonfocal.  LABORATORY DATA:  WBC 6.3, hemoglobin 10.6, hematocrit 32.1, platelets 213,000.  IMPRESSION AND PLAN:  A 25 year old female with: 1. Stage II nodular sclerosing Hodgkin lymphoma diagnosed over 5 years     ago, in clinical remission. 2. Iron deficiency anemia, on supplemental oral iron.  She is on     prenatal vitamins.  Will continue to monitor her.  An iron panel is     pending today.  She will be seen back in 6 months' time for followup.  However, I will see her sooner if need  arises.    ______________________________ Drue Second, M.D. KK/MEDQ  D:  01/22/2011  T:  01/22/2011  Job:  [id number]

## 2011-03-10 LAB — STREP B DNA PROBE: GBS: NEGATIVE

## 2011-04-02 ENCOUNTER — Encounter (HOSPITAL_COMMUNITY): Payer: Self-pay | Admitting: *Deleted

## 2011-04-02 ENCOUNTER — Inpatient Hospital Stay (HOSPITAL_COMMUNITY)
Admission: AD | Admit: 2011-04-02 | Discharge: 2011-04-05 | DRG: 774 | Disposition: A | Discharge status: Home / Self Care | Payer: Medicaid Other | Source: Ambulatory Visit | Attending: Obstetrics and Gynecology | Admitting: Obstetrics and Gynecology

## 2011-04-02 DIAGNOSIS — D573 Sickle-cell trait: Secondary | ICD-10-CM

## 2011-04-02 DIAGNOSIS — D509 Iron deficiency anemia, unspecified: Secondary | ICD-10-CM

## 2011-04-02 DIAGNOSIS — Z148 Genetic carrier of other disease: Secondary | ICD-10-CM

## 2011-04-02 DIAGNOSIS — C8590 Non-Hodgkin lymphoma, unspecified, unspecified site: Secondary | ICD-10-CM

## 2011-04-02 HISTORY — DX: Sickle-cell trait: D57.3

## 2011-04-02 HISTORY — DX: Hodgkin lymphoma, unspecified, unspecified site: C81.90

## 2011-04-02 LAB — CBC
HCT: 31.6 % — ABNORMAL LOW (ref 36.0–46.0)
MCHC: 32 g/dL (ref 30.0–36.0)
Platelets: 204 10*3/uL (ref 150–400)
RDW: 15.5 % (ref 11.5–15.5)
WBC: 6.6 10*3/uL (ref 4.0–10.5)

## 2011-04-02 MED ORDER — ONDANSETRON HCL 4 MG/2ML IJ SOLN: 4.0000 mg | Freq: Four times a day (QID) | INTRAMUSCULAR | Status: DC | PRN

## 2011-04-02 MED ORDER — OXYCODONE-ACETAMINOPHEN 5-325 MG PO TABS: 2.0000 | ORAL_TABLET | ORAL | Status: DC | PRN

## 2011-04-02 MED ORDER — IBUPROFEN 600 MG PO TABS: 600.0000 mg | ORAL_TABLET | Freq: Four times a day (QID) | ORAL | Status: DC | PRN

## 2011-04-02 MED ORDER — LACTATED RINGERS IV SOLN
INTRAVENOUS | Status: DC
  Administered 2011-04-02 – 2011-04-03 (×2): via INTRAVENOUS

## 2011-04-02 MED ORDER — ACETAMINOPHEN 325 MG PO TABS
650.0000 mg | ORAL_TABLET | ORAL | Status: DC | PRN
  Administered 2011-04-03: 650 mg via ORAL
  Filled 2011-04-02: qty 2

## 2011-04-02 MED ORDER — CITRIC ACID-SODIUM CITRATE 334-500 MG/5ML PO SOLN: 30.0000 mL | ORAL | Status: DC | PRN

## 2011-04-02 MED ORDER — OXYTOCIN 20 UNITS IN LACTATED RINGERS INFUSION - SIMPLE: 125.0000 mL/h | Freq: Once | INTRAVENOUS | Status: DC

## 2011-04-02 MED ORDER — FLEET ENEMA 7-19 GM/118ML RE ENEM: 1.0000 | ENEMA | RECTAL | Status: DC | PRN

## 2011-04-02 MED ORDER — LIDOCAINE HCL (PF) 1 % IJ SOLN
30.0000 mL | INTRAMUSCULAR | Status: DC | PRN
  Filled 2011-04-02: qty 30

## 2011-04-02 MED ORDER — LACTATED RINGERS IV SOLN
500.0000 mL | INTRAVENOUS | Status: DC | PRN
Start: 2011-04-02 — End: 2011-04-03
  Administered 2011-04-03 (×2): 300 mL via INTRAVENOUS

## 2011-04-02 MED ORDER — OXYTOCIN BOLUS FROM INFUSION
500.0000 mL | Freq: Once | INTRAVENOUS | Status: DC
  Filled 2011-04-02: qty 500
  Filled 2011-04-02: qty 1000

## 2011-04-02 NOTE — Progress Notes (Signed)
Pt to restroom, states water broke at 1630, clear fluid is still coming.  No bleeding. Ctx's q 10 min.  G3, P1.  Prior vag del, no complications.

## 2011-04-02 NOTE — H&P (Signed)
26 yo G3P1 @ 38+1 weeks presents w/ SROM.  Irregular ctx, no vb.  Past history - See h[facility]ister, GBS neg   H/o Non hogkins lymphoma  AF, VSS Gen - NAD Abd - gravid, NT Cvx - 3cm per RN exam  A/P:  Admit, Exp mngt

## 2011-04-03 ENCOUNTER — Encounter (HOSPITAL_COMMUNITY): Payer: Self-pay | Admitting: Anesthesiology

## 2011-04-03 ENCOUNTER — Inpatient Hospital Stay (HOSPITAL_COMMUNITY): Payer: Medicaid Other | Admitting: Anesthesiology

## 2011-04-03 ENCOUNTER — Encounter (HOSPITAL_COMMUNITY): Payer: Self-pay

## 2011-04-03 LAB — RPR: RPR Ser Ql: NONREACTIVE

## 2011-04-03 MED ORDER — SODIUM CHLORIDE 0.9 % IV SOLN
2.0000 g | Freq: Once | INTRAVENOUS | Status: AC
  Administered 2011-04-03: 2 g via INTRAVENOUS
  Filled 2011-04-03: qty 2000

## 2011-04-03 MED ORDER — BENZOCAINE-MENTHOL 20-0.5 % EX AERO: 1.0000 "application " | INHALATION_SPRAY | CUTANEOUS | Status: DC | PRN

## 2011-04-03 MED ORDER — EPHEDRINE 5 MG/ML INJ
10.0000 mg | INTRAVENOUS | Status: DC | PRN
  Filled 2011-04-03: qty 4

## 2011-04-03 MED ORDER — ONDANSETRON HCL 4 MG/2ML IJ SOLN: 4.0000 mg | INTRAMUSCULAR | Status: DC | PRN

## 2011-04-03 MED ORDER — LACTATED RINGERS IV SOLN
500.0000 mL | Freq: Once | INTRAVENOUS | Status: AC
  Administered 2011-04-03: 500 mL via INTRAVENOUS

## 2011-04-03 MED ORDER — SIMETHICONE 80 MG PO CHEW: 80.0000 mg | CHEWABLE_TABLET | ORAL | Status: DC | PRN

## 2011-04-03 MED ORDER — TETANUS-DIPHTH-ACELL PERTUSSIS 5-2.5-18.5 LF-MCG/0.5 IM SUSP: 0.5000 mL | Freq: Once | INTRAMUSCULAR | Status: DC

## 2011-04-03 MED ORDER — PHENYLEPHRINE 40 MCG/ML (10ML) SYRINGE FOR IV PUSH (FOR BLOOD PRESSURE SUPPORT)
80.0000 ug | PREFILLED_SYRINGE | INTRAVENOUS | Status: DC | PRN
  Filled 2011-04-03: qty 5

## 2011-04-03 MED ORDER — LANOLIN HYDROUS EX OINT: TOPICAL_OINTMENT | CUTANEOUS | Status: DC | PRN

## 2011-04-03 MED ORDER — PRENATAL MULTIVITAMIN CH
1.0000 | ORAL_TABLET | Freq: Every day | ORAL | Status: DC
  Administered 2011-04-04 – 2011-04-05 (×2): 1 via ORAL
  Filled 2011-04-03: qty 1

## 2011-04-03 MED ORDER — WITCH HAZEL-GLYCERIN EX PADS: 1.0000 "application " | MEDICATED_PAD | CUTANEOUS | Status: DC | PRN

## 2011-04-03 MED ORDER — PHENYLEPHRINE 40 MCG/ML (10ML) SYRINGE FOR IV PUSH (FOR BLOOD PRESSURE SUPPORT): 80.0000 ug | PREFILLED_SYRINGE | INTRAVENOUS | Status: DC | PRN

## 2011-04-03 MED ORDER — GENTAMICIN SULFATE 40 MG/ML IJ SOLN
150.0000 mg | Freq: Three times a day (TID) | INTRAVENOUS | Status: DC
  Administered 2011-04-03: 150 mg via INTRAVENOUS
  Filled 2011-04-03 (×2): qty 3.75

## 2011-04-03 MED ORDER — DIPHENHYDRAMINE HCL 25 MG PO CAPS: 25.0000 mg | ORAL_CAPSULE | Freq: Four times a day (QID) | ORAL | Status: DC | PRN

## 2011-04-03 MED ORDER — DIBUCAINE 1 % RE OINT: 1.0000 "application " | TOPICAL_OINTMENT | RECTAL | Status: DC | PRN

## 2011-04-03 MED ORDER — MEASLES, MUMPS & RUBELLA VAC ~~LOC~~ INJ
0.5000 mL | INJECTION | Freq: Once | SUBCUTANEOUS | Status: DC
  Filled 2011-04-03: qty 0.5

## 2011-04-03 MED ORDER — IBUPROFEN 600 MG PO TABS
600.0000 mg | ORAL_TABLET | Freq: Four times a day (QID) | ORAL | Status: DC
  Administered 2011-04-03 – 2011-04-05 (×8): 600 mg via ORAL
  Filled 2011-04-03 (×8): qty 1

## 2011-04-03 MED ORDER — DIPHENHYDRAMINE HCL 50 MG/ML IJ SOLN: 12.5000 mg | INTRAMUSCULAR | Status: DC | PRN

## 2011-04-03 MED ORDER — EPHEDRINE 5 MG/ML INJ: 10.0000 mg | INTRAVENOUS | Status: DC | PRN

## 2011-04-03 MED ORDER — LIDOCAINE HCL 1.5 % IJ SOLN
INTRAMUSCULAR | Status: DC | PRN
  Administered 2011-04-03 (×2): 5 mL via EPIDURAL

## 2011-04-03 MED ORDER — MEDROXYPROGESTERONE ACETATE 150 MG/ML IM SUSP: 150.0000 mg | INTRAMUSCULAR | Status: DC | PRN

## 2011-04-03 MED ORDER — SENNOSIDES-DOCUSATE SODIUM 8.6-50 MG PO TABS
2.0000 | ORAL_TABLET | Freq: Every day | ORAL | Status: DC
  Administered 2011-04-03 – 2011-04-04 (×2): 2 via ORAL

## 2011-04-03 MED ORDER — ONDANSETRON HCL 4 MG PO TABS: 4.0000 mg | ORAL_TABLET | ORAL | Status: DC | PRN

## 2011-04-03 MED ORDER — FENTANYL 2.5 MCG/ML BUPIVACAINE 1/10 % EPIDURAL INFUSION (WH - ANES)
14.0000 mL/h | INTRAMUSCULAR | Status: DC
  Administered 2011-04-03 (×2): 14 mL/h via EPIDURAL
  Filled 2011-04-03 (×2): qty 60

## 2011-04-03 MED ORDER — OXYCODONE-ACETAMINOPHEN 5-325 MG PO TABS: 1.0000 | ORAL_TABLET | ORAL | Status: DC | PRN

## 2011-04-03 NOTE — Progress Notes (Signed)
Nursery notified to come assess NBI

## 2011-04-03 NOTE — Anesthesia Postprocedure Evaluation (Signed)
  Anesthesia Post-op Note  Patient: Katelyn Cobb  Procedure(s) Performed: * No procedures listed *  Patient Location: PACU and Mother/Baby  Anesthesia Type: Epidural  Level of Consciousness: awake, alert  and oriented  Airway and Oxygen Therapy: Patient Spontanous Breathing   Post-op Assessment: Patient's Cardiovascular Status Stable and Respiratory Function Stable  Post-op Vital Signs: stable  Complications: No apparent anesthesia complications

## 2011-04-03 NOTE — Progress Notes (Signed)
Infant returned to patient bedside from central nursery by [employer]

## 2011-04-03 NOTE — Anesthesia Preprocedure Evaluation (Signed)
Anesthesia Evaluation  Patient identified by MRN, date of birth, ID band Patient awake    Reviewed: Allergy & Precautions, H&P , Patient's Chart, lab work & pertinent test results  Airway Mallampati: II TM Distance: >3 FB Neck ROM: full    Dental No notable dental hx.    Pulmonary neg pulmonary ROS,  clear to auscultation  Pulmonary exam normal       Cardiovascular neg cardio ROS regular Normal    Neuro/Psych Negative Neurological ROS  Negative Psych ROS   GI/Hepatic negative GI ROS, Neg liver ROS,   Endo/Other  Negative Endocrine ROS  Renal/GU negative Renal ROS     Musculoskeletal   Abdominal   Peds  Hematology negative hematology ROS (+)   Anesthesia Other Findings hogkins lymphoma  Reproductive/Obstetrics (+) Pregnancy                           Anesthesia Physical Anesthesia Plan  ASA: III  Anesthesia Plan: Epidural   Post-op Pain Management:    Induction:   Airway Management Planned:   Additional Equipment:   Intra-op Plan:   Post-operative Plan:   Informed Consent: I have reviewed the patients History and Physical, chart, labs and discussed the procedure including the risks, benefits and alternatives for the proposed anesthesia with the patient or authorized representative who has indicated his/her understanding and acceptance.     Plan Discussed with:   Anesthesia Plan Comments:         Anesthesia Quick Evaluation

## 2011-04-03 NOTE — Anesthesia Postprocedure Evaluation (Signed)
Anesthesia Post Note  Patient: Katelyn Cobb  Procedure(s) Performed: * No procedures listed *  Anesthesia type: Epidural  Patient location: Awaiting discharge to [facility]  Post pain: Pain level controlled  Post assessment: Post-op Vital signs reviewed  Last Vitals:  Filed Vitals:   04/03/11 0901  BP: 117/88  Pulse: 105  Temp:   Resp:     Post vital signs: Reviewed  Level of consciousness: awake  Complications: No apparent anesthesia complications

## 2011-04-03 NOTE — Progress Notes (Signed)
OK to start pushing

## 2011-04-03 NOTE — Progress Notes (Signed)
SVD of  female infant w/ apgars of 4,8.  Infant responded quickly to stimulation.   Blow by O2 and suction Placenta delivered spontaneous w/ 3VC.   Right periurethral lac repaired w/ 3-0 vicryl.  Fundus firm.  EBL 400 . Arterial cord pH 7.29

## 2011-04-03 NOTE — Progress Notes (Signed)
Pt comfortable w/ epidural. Temp 102.1   FHT:  Good BTBV, variable and early decels Toco Q2-3 Cvx 8-9/C/0  A/P:  Continue exp mngt IV Amp/Gent

## 2011-04-03 NOTE — Addendum Note (Signed)
Addendum  created 04/03/11 1923 by Len Blalock, CRNA   Modules edited:Charges VN, Notes Section

## 2011-04-03 NOTE — Progress Notes (Signed)
Pulse ox applied.

## 2011-04-03 NOTE — Anesthesia Procedure Notes (Signed)
Epidural Patient location during procedure: OB Start time: 04/03/2011 2:03 AM  Staffing Anesthesiologist: Brayton Caves R Performed by: anesthesiologist   Preanesthetic Checklist Completed: patient identified, site marked, surgical consent, pre-op evaluation, timeout performed, IV checked, risks and benefits discussed and monitors and equipment checked  Epidural Patient position: sitting Prep: site prepped and draped and DuraPrep Patient monitoring: continuous pulse ox and blood pressure Approach: midline Injection technique: LOR air and LOR saline  Needle:  Needle type: Tuohy  Needle gauge: 17 G Needle length: 9 cm Needle insertion depth: 5 cm cm Catheter type: closed end flexible Catheter size: 19 Gauge Catheter at skin depth: 10 cm Test dose: negative  Assessment Events: blood not aspirated, injection not painful, no injection resistance, negative IV test and no paresthesia  Additional Notes Patient identified.  Risk benefits discussed including failed block, incomplete pain control, headache, nerve damage, paralysis, blood pressure changes, nausea, vomiting, reactions to medication both toxic or allergic, and postpartum back pain.  Patient expressed understanding and wished to proceed.  All questions were answered.  Sterile technique used throughout procedure and epidural site dressed with sterile barrier dressing. No paresthesia or other complications noted.The patient did not experience any signs of intravascular injection such as tinnitus or metallic taste in mouth nor signs of intrathecal spread such as rapid motor block. Please see nursing notes for vital signs.

## 2011-04-03 NOTE — Consult Note (Signed)
ANTIBIOTIC CONSULT NOTE - INITIAL  Pharmacy Consult for Gentamicin Indication: Maternal temperature  No Known Allergies  Patient Measurements: Height: 5\' 2"  (157.5 cm) Weight: 180 lb 3.2 oz (81.738 kg) IBW/kg (Calculated) : 50.1  Adjusted Body Weight: 60 kg Vital Signs: Temp: 102.1 F (38.9 C) (01/19 0631) Temp src: Axillary (01/19 0631) BP: 155/90 mmHg (01/19 0531) Pulse Rate: 155  (01/19 0531) Intake/Output from previous day: 01/18 0701 - 01/19 0700 In: -  Out: 350 [Urine:350] Intake/Output from this shift: Total I/O In: -  Out: 350 [Urine:350]  Labs:  [facility] 04/02/11 2000  WBC 6.6  HGB 10.1*  PLT 204  LABCREA --  CREATININE --   Estimated Creatinine Clearance: 104.6 ml/min (by C-G formula based on Cr of 0.52). Microbiology: Recent Results (from the past 720 hour(s))  STREP B DNA PROBE     Status: Normal      Component Value Range Status Comment   Group B Strep Ag Negative        Medical History: Past Medical History  Diagnosis Date  . Lymphoma in remission 01/13/2011  . Lymphoma, Hodgkin's 2006    in remission  . Sickle cell trait     Medications: Ampicillin 2 gm IVPB x one dose  Assessment: Pt is a 26 yo G3P1 at @[redacted]  weeks EGA with increased temperature and presumed chlorioamnionitis   Goal of Therapy:  Gentamicin peaks 6-8 mg/dl; troughs < 1 mg/dl   Plan: Gentamicin 161 mg IVPB every 8 hours. Serum creatinine and gentamicin levels as indicated.  Arelia Sneddon 04/03/2011,6:47 AM

## 2011-04-04 LAB — CBC
HCT: 27.3 % — ABNORMAL LOW (ref 36.0–46.0)
Hemoglobin: 9 g/dL — ABNORMAL LOW (ref 12.0–15.0)
MCV: 73.2 fL — ABNORMAL LOW (ref 78.0–100.0)
RBC: 3.73 MIL/uL — ABNORMAL LOW (ref 3.87–5.11)
WBC: 15 10*3/uL — ABNORMAL HIGH (ref 4.0–10.5)

## 2011-04-04 NOTE — Progress Notes (Signed)
Post Partum Day 1 Subjective: no complaints, up ad lib and tolerating PO  Objective: Blood pressure 112/63, pulse 82, temperature 98 F (36.7 C), temperature source Oral, resp. rate 18, height 5\' 2"  (1.575 m), weight 81.738 kg (180 lb 3.2 oz), SpO2 98.00%, unknown if currently breastfeeding.  Physical Exam:  General: alert and cooperative Lochia: appropriate Uterine Fundus: firm Incision: na DVT Evaluation: No evidence of DVT seen on physical exam.   Basename 04/04/11 0500 04/02/11 2000  HGB 9.0* 10.1*  HCT 27.3* 31.6*    Assessment/Plan: Plan for discharge tomorrow, Breastfeeding and Lactation consult   LOS: 2 days   Dioselina Brumbaugh 04/04/2011, 9:19 AM

## 2011-04-05 LAB — COMPREHENSIVE METABOLIC PANEL
Alkaline Phosphatase: 84 U/L (ref 39–117)
BUN: 9 mg/dL (ref 6–23)
Calcium: 9.2 mg/dL (ref 8.4–10.5)
GFR calc Af Amer: >90 mL/min (ref >90)
GFR calc non Af Amer: >90 mL/min (ref >90)
Glucose, Bld: 61 mg/dL — ABNORMAL LOW (ref 70–99)
Total Protein: 5.1 g/dL — ABNORMAL LOW (ref 6.0–8.3)

## 2011-04-05 LAB — CBC
HCT: 27.7 % — ABNORMAL LOW (ref 36.0–46.0)
Hemoglobin: 8.9 g/dL — ABNORMAL LOW (ref 12.0–15.0)
MCH: 23.5 pg — ABNORMAL LOW (ref 26.0–34.0)
MCHC: 32.1 g/dL (ref 30.0–36.0)

## 2011-04-05 MED ORDER — IBUPROFEN 600 MG PO TABS: 600.0000 mg | ORAL_TABLET | Freq: Four times a day (QID) | ORAL | Status: AC

## 2011-04-05 NOTE — Progress Notes (Signed)
Post Partum Day 2 Subjective: up ad lib, voiding, tolerating PO, + flatus and denies HA today. Frontal HA yesterday without visual disturbance..H/O of PIH with previous pregnancy  Objective: Blood pressure 127/96, pulse 71, temperature 98.1 F (36.7 C), temperature source Oral, resp. rate 18, height 5\' 2"  (1.575 m), weight 81.738 kg (180 lb 3.2 oz), SpO2 98.00%, unknown if currently breastfeeding.  Physical Exam:  General: alert and cooperative Lochia: appropriate Uterine Fundus: firm Perineum intact DVT Evaluation: No evidence of DVT seen on physical exam. DTR's 1+ no clonus. No pedal edema noted  Basename 04/04/11 0500 04/02/11 2000  HGB 9.0* 10.1*  HCT 27.3* 31.6*    Assessment/Plan: CBC  CMP    LOS: 3 days   Jacaria Colburn G 04/05/2011, 8:41 AM

## 2011-04-05 NOTE — Discharge Summary (Signed)
Obstetric Discharge Summary Reason for Admission: rupture of membranes Prenatal Procedures:  ultrasound Intrapartum Procedures: spontaneous vaginal delivery Postpartum Procedures: none Complications-Operative and Postpartum: periurethral lac HGB  Date Value Range Status  01/22/2011 10.6* 11.6-15.9 (g/dL) Final  03/15/9145 82.9* 11.6-15.9 (g/dL) Final     Hemoglobin  Date Value Range Status  04/05/2011 8.9* 12.0-15.0 (g/dL) Final     HCT  Date Value Range Status  04/05/2011 27.7* 36.0-46.0 (%) Final  01/22/2011 32.1* 34.8-46.6 (%) Final  05/13/2009 35.2  34.8-46.6 (%) Final    Discharge Diagnoses: Term Pregnancy-delivered  Discharge Information: Date: 04/05/2011 Activity: pelvic rest Diet: routine Medications: PNV and Ibuprofen Condition: stable Instructions: refer to practice specific booklet Discharge to: home   Newborn Data: Live born female  Birth Weight: 7 lb 3.3 oz (3270 g) APGAR: 4, 8  Home with mother PIH warnings reviewed. RTO in 2 days for BP check  Katelyn Cobb G 04/05/2011, 11:27 AM

## 2011-04-05 NOTE — Progress Notes (Signed)
Patient ID: Katelyn Cobb, female   DOB: 1985-05-07, 26 y.o.   MRN: [id number] Addendum: Labs WNL. PIH warnings reviewed with patient. Discharge home and RTO 2 days for bp check

## 2011-04-30 ENCOUNTER — Other Ambulatory Visit: Payer: Self-pay | Admitting: Obstetrics and Gynecology

## 2011-08-30 ENCOUNTER — Ambulatory Visit: Payer: PRIVATE HEALTH INSURANCE | Admitting: Oncology

## 2011-08-30 ENCOUNTER — Other Ambulatory Visit: Payer: PRIVATE HEALTH INSURANCE | Admitting: Lab

## 2011-09-17 ENCOUNTER — Telehealth: Payer: Self-pay | Admitting: Oncology

## 2011-09-17 NOTE — Telephone Encounter (Signed)
Pt called today to r/s 6/17 appt. Pt given new appt for 9/13 @ 9 am.

## 2011-11-25 ENCOUNTER — Other Ambulatory Visit: Payer: Self-pay | Admitting: Medical Oncology

## 2011-11-25 DIAGNOSIS — C8589 Other specified types of non-Hodgkin lymphoma, extranodal and solid organ sites: Secondary | ICD-10-CM

## 2011-11-26 ENCOUNTER — Telehealth: Payer: Self-pay | Admitting: *Deleted

## 2011-11-26 ENCOUNTER — Ambulatory Visit: Payer: Self-pay | Admitting: Oncology

## 2011-11-26 ENCOUNTER — Encounter: Payer: Self-pay | Admitting: Oncology

## 2011-11-26 ENCOUNTER — Other Ambulatory Visit (HOSPITAL_BASED_OUTPATIENT_CLINIC_OR_DEPARTMENT_OTHER): Payer: Self-pay

## 2011-11-26 VITALS — BP 142/84 | HR 102 | Temp 99.0°F | Resp 20 | Ht 62.0 in | Wt 162.8 lb

## 2011-11-26 DIAGNOSIS — C8119 Nodular sclerosis classical Hodgkin lymphoma, extranodal and solid organ sites: Secondary | ICD-10-CM

## 2011-11-26 DIAGNOSIS — C859A Non-Hodgkin lymphoma, unspecified, in remission: Secondary | ICD-10-CM

## 2011-11-26 DIAGNOSIS — C8589 Other specified types of non-Hodgkin lymphoma, extranodal and solid organ sites: Secondary | ICD-10-CM

## 2011-11-26 DIAGNOSIS — C8590 Non-Hodgkin lymphoma, unspecified, unspecified site: Secondary | ICD-10-CM

## 2011-11-26 DIAGNOSIS — D509 Iron deficiency anemia, unspecified: Secondary | ICD-10-CM

## 2011-11-26 LAB — COMPREHENSIVE METABOLIC PANEL (CC13)
CO2: 21 mEq/L — ABNORMAL LOW (ref 22–29)
Creatinine: 0.9 mg/dL (ref 0.6–1.1)
Glucose: 179 mg/dl — ABNORMAL HIGH (ref 70–99)
Sodium: 138 mEq/L (ref 136–145)
Total Bilirubin: 0.5 mg/dL (ref 0.20–1.20)
Total Protein: 7.6 g/dL (ref 6.4–8.3)

## 2011-11-26 LAB — CBC WITH DIFFERENTIAL/PLATELET
Basophils Absolute: 0 10*3/uL (ref 0.0–0.1)
Eosinophils Absolute: 0.2 10*3/uL (ref 0.0–0.5)
HCT: 39.3 % (ref 34.8–46.6)
HGB: 12.9 g/dL (ref 11.6–15.9)
LYMPH%: 43.3 % (ref 14.0–49.7)
MONO#: 0.2 10*3/uL (ref 0.1–0.9)
NEUT#: 1.9 10*3/uL (ref 1.5–6.5)
NEUT%: 45.6 % (ref 38.4–76.8)
Platelets: 222 10*3/uL (ref 145–400)
WBC: 4.2 10*3/uL (ref 3.9–10.3)

## 2011-11-26 NOTE — Progress Notes (Signed)
CC:   Katelyn Cobb. Ezzard Standing, M.D. Freddy Finner, M.D.  DIAGNOSES: 52. 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 2. The patient also has iron deficiency anemia. 3. The patient is currently [redacted] weeks pregnant.  PAST THERAPY: 1. Status post 4 cycles of ABVD chemotherapy from 04/10/2004 to     07/17/2004. 2. Mantle field radiation therapy administered from 08/12/2004 to     09/04/2004. 3. The patient is on oral iron for iron deficiency anemia.  INTERVAL HISTORY:  The patient is seen in followup today.  Overall she seems to be doing quite well without any significant problems.  However, she is tired and fatigued.  She is [redacted] weeks gestation.  She has no nausea, no vomiting.  No fevers, no chills, and remainder of the 10 point review of systems is negative. Physical examination: Patient is awake alert in no acute distress Filed Vitals:   11/26/11 0928  BP: 142/84  Pulse: 102  Temp: 99 F (37.2 C)  TempSrc: Oral  Resp: 20  Height: 5\' 2"  (1.575 m)  Weight: 162 lb 12.8 oz (73.846 kg)  HEENT exam is no mitral sclerae anicteric no conjunctival pallor oral mucosa is moist neck is supple no palpable cervical supraclavicular axillary or inguinal adenopathy lungs are clear to auscultation and percussion cardiovascular is regular rate rhythm no murmurs gallops or rubs abdomen is soft nontender nondistended bowel sounds are present no HSM extremities no edema neuro patient's alert oriented otherwise nonfocal.  Lab Results  Component Value Date   WBC 4.2 11/26/2011   HGB 12.9 11/26/2011   HCT 39.3 11/26/2011   MCV 82.0 11/26/2011   PLT 222 11/26/2011    IMPRESSION AND PLAN:  A 27 year old female with: 1. Stage II nodular sclerosing Hodgkin lymphoma diagnosed over 5 years     ago, in clinical remission. 2. Iron deficiency anemia, on supplemental oral iron.  She is on     prenatal vitamins.  Will continue to monitor her.  An iron  panel is     pending today.  She will be seen back in 6 months' time for followup.  However, I will see her sooner if need arises.

## 2011-11-26 NOTE — Patient Instructions (Addendum)
Doing well no evidence of recurrent disease.  I will continue to see you once a year

## 2011-11-26 NOTE — Telephone Encounter (Signed)
Gave patient appointment for 11-24-2012 starting at 9:30am

## 2012-11-24 ENCOUNTER — Telehealth: Payer: Self-pay | Admitting: Oncology

## 2012-11-24 ENCOUNTER — Ambulatory Visit (HOSPITAL_BASED_OUTPATIENT_CLINIC_OR_DEPARTMENT_OTHER): Payer: BC Managed Care – PPO | Admitting: Oncology

## 2012-11-24 ENCOUNTER — Other Ambulatory Visit (HOSPITAL_BASED_OUTPATIENT_CLINIC_OR_DEPARTMENT_OTHER): Payer: BC Managed Care – PPO | Admitting: Lab

## 2012-11-24 ENCOUNTER — Encounter: Payer: Self-pay | Admitting: Oncology

## 2012-11-24 VITALS — BP 141/97 | HR 89 | Temp 98.1°F | Resp 18 | Ht 62.0 in | Wt 171.0 lb

## 2012-11-24 DIAGNOSIS — C8589 Other specified types of non-Hodgkin lymphoma, extranodal and solid organ sites: Secondary | ICD-10-CM

## 2012-11-24 DIAGNOSIS — C859A Non-Hodgkin lymphoma, unspecified, in remission: Secondary | ICD-10-CM

## 2012-11-24 DIAGNOSIS — C801 Malignant (primary) neoplasm, unspecified: Secondary | ICD-10-CM | POA: Insufficient documentation

## 2012-11-24 DIAGNOSIS — C8590 Non-Hodgkin lymphoma, unspecified, unspecified site: Secondary | ICD-10-CM

## 2012-11-24 LAB — CBC WITH DIFFERENTIAL/PLATELET
Eosinophils Absolute: 0.2 10*3/uL (ref 0.0–0.5)
LYMPH%: 36.7 % (ref 14.0–49.7)
MONO#: 0.5 10*3/uL (ref 0.1–0.9)
NEUT#: 2.9 10*3/uL (ref 1.5–6.5)
Platelets: 242 10*3/uL (ref 145–400)
RBC: 4.75 10*6/uL (ref 3.70–5.45)
WBC: 5.9 10*3/uL (ref 3.9–10.3)

## 2012-11-24 LAB — COMPREHENSIVE METABOLIC PANEL (CC13)
Albumin: 3.8 g/dL (ref 3.5–5.0)
CO2: 26 mEq/L (ref 22–29)
Calcium: 9.1 mg/dL (ref 8.4–10.4)
Chloride: 109 mEq/L (ref 98–109)
Glucose: 111 mg/dl (ref 70–140)
Potassium: 4 mEq/L (ref 3.5–5.1)
Sodium: 141 mEq/L (ref 136–145)
Total Bilirubin: 0.37 mg/dL (ref 0.20–1.20)
Total Protein: 7.7 g/dL (ref 6.4–8.3)

## 2012-11-24 NOTE — Patient Instructions (Addendum)
Doing well  We will do CT scans  For staging scans for recurrence

## 2012-11-24 NOTE — Progress Notes (Signed)
OFFICE PROGRESS NOTE  CC**  No primary provider on file. No primary provider on file.  DIAGNOSIS: 27 year old female with stage II nodular sclerosing Hodgkin lymphoma diagnosed January 2006  PRIOR THERAPY:  #1status post 4 cycles of ABVD chemotherapy from genera 27 2006 through 07/17/2004.  #2 Mantle field radiation therapy administered from 08/12/2004 through 09/04/2004  #3 iron deficiency anemia on oral iron  CURRENT THERAPY:observation  INTERVAL HISTORY: Katelyn Cobb 27 y.o. female returns for followup visit. Overall she's doing well without any complaints. She denies any fevers chills night sweats headaches shortness of breath chest pains palpitations no myalgias and arthralgias no peripheral paresthesias no pruritus she has not noticed any lumps or bumps. Should continues to work full-time. Remainder of the 10 point review of systems is negative.  MEDICAL HISTORY: Past Medical History  Diagnosis Date  . Lymphoma in remission 01/13/2011  . Lymphoma, Hodgkin's 2006    in remission  . Sickle cell trait   . Cancer     lymphomoa    ALLERGIES:  has No Known Allergies.  MEDICATIONS:  Current Outpatient Prescriptions  Medication Sig Dispense Refill  . medroxyPROGESTERone (DEPO-SUBQ PROVERA) 104 MG/0.65ML injection Inject 104 mg into the skin every 3 (three) months.       No current facility-administered medications for this visit.    SURGICAL HISTORY:  Past Surgical History  Procedure Laterality Date  . Lymph node biopsy  2005  . Lymph node biopsy  2005    neck  . Portacath placement  2006    for chemo treatment for lymphoma    REVIEW OF SYSTEMS:  A comprehensive review of systems was negative.   HEALTH MAINTENANCE:   PHYSICAL EXAMINATION: Blood pressure 141/97, pulse 89, temperature 98.1 F (36.7 C), temperature source Oral, resp. rate 18, height 5\' 2"  (1.575 m), weight 171 lb (77.565 kg), unknown if currently breastfeeding. Body mass index is 31.27  kg/(m^2). ECOG PERFORMANCE STATUS: 0 - Asymptomatic   General appearance: alert, cooperative and appears stated age Head: Normocephalic, without obvious abnormality, atraumatic Neck: no adenopathy, no carotid bruit, no JVD, supple, symmetrical, trachea midline and thyroid not enlarged, symmetric, no tenderness/mass/nodules Lymph nodes: Cervical, supraclavicular, and axillary nodes normal. Resp: clear to auscultation bilaterally and normal percussion bilaterally Back: symmetric, no curvature. ROM normal. No CVA tenderness. Cardio: regular rate and rhythm, S1, S2 normal, no murmur, click, rub or gallop GI: soft, non-tender; bowel sounds normal; no masses,  no organomegaly Extremities: extremities normal, atraumatic, no cyanosis or edema Neurologic: Grossly normal   LABORATORY DATA: Lab Results  Component Value Date   WBC 5.9 11/24/2012   HGB 12.6 11/24/2012   HCT 38.1 11/24/2012   MCV 80.2 11/24/2012   PLT 242 11/24/2012      Chemistry      Component Value Date/Time   NA 141 11/24/2012 0943   NA 139 04/05/2011 0910   NA 137 05/13/2009 1211   K 4.0 11/24/2012 0943   K 3.3* 04/05/2011 0910   K 4.3 05/13/2009 1211   CL 107 11/26/2011 0911   CL 105 04/05/2011 0910   CL 107 05/13/2009 1211   CO2 26 11/24/2012 0943   CO2 24 04/05/2011 0910   CO2 28 05/13/2009 1211   BUN 11.2 11/24/2012 0943   BUN 9 04/05/2011 0910   BUN 8 05/13/2009 1211   CREATININE 0.8 11/24/2012 0943   CREATININE 0.85 04/05/2011 0910   CREATININE 0.5* 05/13/2009 1211      Component Value Date/Time   CALCIUM  9.1 11/24/2012 0943   CALCIUM 9.2 04/05/2011 0910   CALCIUM 9.2 05/13/2009 1211   ALKPHOS 66 11/24/2012 0943   ALKPHOS 84 04/05/2011 0910   ALKPHOS 58 05/13/2009 1211   AST 16 11/24/2012 0943   AST 32 04/05/2011 0910   AST 23 05/13/2009 1211   ALT 17 11/24/2012 0943   ALT 22 04/05/2011 0910   ALT 26 05/13/2009 1211   BILITOT 0.37 11/24/2012 0943   BILITOT 0.2* 04/05/2011 0910   BILITOT 0.60 05/13/2009 1211       RADIOGRAPHIC  STUDIES:  No results found.  ASSESSMENT: 27 year old female with history of nodular sclerosing Hodgkin lymphoma status post 4 cycles of ABVD followed by mantle field radiation therapy she completed all of her treatment June 2006. She is without any evidence of recurrent disease. Patient at this time could go to as needed basis in terms of followup. However patient is very concerned. She wants to have another set of scans just to make sure she doesn't have any cancer and after that she will does cause for it with me regarding seeing only her primary care physician. But she does tell me she doesn't have a primary.   PLAN:   #1 we will proceed with staging scans.  #2 I will see her back in a year's time.  #3 she knows to call with any problems   All questions were answered. The patient knows to call the clinic with any problems, questions or concerns. We can certainly see the patient much sooner if necessary.  I spent 20 minutes counseling the patient face to face. The total time spent in the appointment was 25 minutes.    Drue Second, MD Medical/Oncology [facility] [phone] (beeper) [phone] (Office)  11/24/2012, 10:23 AM

## 2012-11-27 ENCOUNTER — Encounter (HOSPITAL_COMMUNITY): Payer: Self-pay

## 2012-11-27 ENCOUNTER — Ambulatory Visit (HOSPITAL_COMMUNITY)
Admission: RE | Admit: 2012-11-27 | Discharge: 2012-11-27 | Disposition: A | Discharge status: Home / Self Care | Payer: BC Managed Care – PPO | Source: Ambulatory Visit | Attending: Oncology | Admitting: Oncology

## 2012-11-27 DIAGNOSIS — K7689 Other specified diseases of liver: Secondary | ICD-10-CM | POA: Insufficient documentation

## 2012-11-27 DIAGNOSIS — N281 Cyst of kidney, acquired: Secondary | ICD-10-CM | POA: Insufficient documentation

## 2012-11-27 DIAGNOSIS — C8590 Non-Hodgkin lymphoma, unspecified, unspecified site: Secondary | ICD-10-CM

## 2012-11-27 DIAGNOSIS — C8190 Hodgkin lymphoma, unspecified, unspecified site: Secondary | ICD-10-CM | POA: Insufficient documentation

## 2012-11-27 HISTORY — DX: Essential (primary) hypertension: I10

## 2012-11-27 MED ORDER — IOHEXOL 300 MG/ML  SOLN
100.0000 mL | Freq: Once | INTRAMUSCULAR | Status: AC | PRN
  Administered 2012-11-27: 100 mL via INTRAVENOUS

## 2012-11-28 NOTE — Progress Notes (Signed)
Quick Note:  Call patient: CT scans normal ______

## 2012-12-04 ENCOUNTER — Telehealth: Payer: Self-pay | Admitting: Medical Oncology

## 2012-12-04 NOTE — Telephone Encounter (Signed)
Per MD, informed pt CT scans resulted normal. Patient expressed verbal understanding and thanks, denies questions at this time. Encouraged patient to call office with any concerns or questions.

## 2012-12-04 NOTE — Telephone Encounter (Signed)
Message copied by Rexene Edison on Mon Dec 04, 2012 10:25 AM ------      Message from: Katelyn Cobb      Created: Tue Nov 28, 2012  4:55 PM       Call patient: CT scans normal ------

## 2013-11-15 ENCOUNTER — Telehealth: Payer: Self-pay | Admitting: Hematology and Oncology

## 2013-11-15 NOTE — Telephone Encounter (Signed)
, °

## 2013-11-23 ENCOUNTER — Other Ambulatory Visit: Payer: BC Managed Care – PPO

## 2013-11-23 ENCOUNTER — Ambulatory Visit: Payer: BC Managed Care – PPO | Admitting: Oncology

## 2013-12-12 ENCOUNTER — Other Ambulatory Visit: Payer: Self-pay

## 2013-12-12 DIAGNOSIS — C8590 Non-Hodgkin lymphoma, unspecified, unspecified site: Secondary | ICD-10-CM

## 2013-12-13 ENCOUNTER — Other Ambulatory Visit: Payer: BC Managed Care – PPO

## 2013-12-13 ENCOUNTER — Ambulatory Visit: Payer: BC Managed Care – PPO | Admitting: Hematology and Oncology

## 2014-01-14 ENCOUNTER — Encounter (HOSPITAL_COMMUNITY): Payer: Self-pay

## 2014-08-26 ENCOUNTER — Other Ambulatory Visit: Payer: Self-pay | Admitting: Obstetrics and Gynecology

## 2014-08-27 LAB — CYTOLOGY - PAP

## 2016-01-20 ENCOUNTER — Telehealth: Payer: Self-pay | Admitting: *Deleted

## 2016-01-20 NOTE — Telephone Encounter (Signed)
On 01-20-16 mail medical records to patient 

## 2016-01-26 ENCOUNTER — Telehealth: Payer: Self-pay | Admitting: *Deleted

## 2016-01-26 NOTE — Telephone Encounter (Signed)
"  Kim with Dr. Harlow Mares office calling for the fax number.  We need patients medical and radiation oncology records from 2006 to 2009.  Was seen by Dr. Elba Barman."   Provided Medical records fax number.

## 2016-01-30 NOTE — Telephone Encounter (Signed)
"  I need my records from Dr. Elba Barman with radiation.  I called three weeks ago and have yet to receive them." Observed records were mailed 01-20-2016.  Read address in EPIC demographics but she no longer lives on [address].  Call transfererd to Radiation department.

## 2016-06-08 ENCOUNTER — Other Ambulatory Visit: Payer: Self-pay | Admitting: Plastic Surgery

## 2016-07-30 ENCOUNTER — Encounter (HOSPITAL_COMMUNITY): Payer: Self-pay | Admitting: Emergency Medicine

## 2016-07-30 ENCOUNTER — Emergency Department (HOSPITAL_COMMUNITY): Payer: BC Managed Care – PPO

## 2016-07-30 ENCOUNTER — Encounter: Payer: Self-pay | Admitting: Gastroenterology

## 2016-07-30 ENCOUNTER — Emergency Department (HOSPITAL_COMMUNITY)
Admission: EM | Admit: 2016-07-30 | Discharge: 2016-07-30 | Disposition: A | Discharge status: Home / Self Care | Payer: BC Managed Care – PPO | Attending: Emergency Medicine | Admitting: Emergency Medicine

## 2016-07-30 DIAGNOSIS — R112 Nausea with vomiting, unspecified: Secondary | ICD-10-CM | POA: Diagnosis present

## 2016-07-30 DIAGNOSIS — R1110 Vomiting, unspecified: Secondary | ICD-10-CM

## 2016-07-30 DIAGNOSIS — R1011 Right upper quadrant pain: Secondary | ICD-10-CM | POA: Insufficient documentation

## 2016-07-30 DIAGNOSIS — I10 Essential (primary) hypertension: Secondary | ICD-10-CM | POA: Insufficient documentation

## 2016-07-30 DIAGNOSIS — K529 Noninfective gastroenteritis and colitis, unspecified: Secondary | ICD-10-CM | POA: Insufficient documentation

## 2016-07-30 DIAGNOSIS — Z8571 Personal history of Hodgkin lymphoma: Secondary | ICD-10-CM | POA: Insufficient documentation

## 2016-07-30 LAB — COMPREHENSIVE METABOLIC PANEL
ALT: 15 U/L (ref 14–54)
ANION GAP: 9 (ref 5–15)
AST: 24 U/L (ref 15–41)
Albumin: 4 g/dL (ref 3.5–5.0)
Alkaline Phosphatase: 68 U/L (ref 38–126)
BUN: 11 mg/dL (ref 6–20)
CO2: 26 mmol/L (ref 22–32)
CREATININE: 0.78 mg/dL (ref 0.44–1.00)
Calcium: 9.3 mg/dL (ref 8.9–10.3)
Chloride: 103 mmol/L (ref 101–111)
Glucose, Bld: 164 mg/dL — ABNORMAL HIGH (ref 65–99)
Potassium: 3.6 mmol/L (ref 3.5–5.1)
Sodium: 138 mmol/L (ref 135–145)
Total Bilirubin: 0.3 mg/dL (ref 0.3–1.2)
Total Protein: 7.9 g/dL (ref 6.5–8.1)

## 2016-07-30 LAB — I-STAT BETA HCG BLOOD, ED (MC, WL, AP ONLY)

## 2016-07-30 LAB — URINALYSIS, ROUTINE W REFLEX MICROSCOPIC
Bilirubin Urine: NEGATIVE
Glucose, UA: NEGATIVE mg/dL
Hgb urine dipstick: NEGATIVE
Ketones, ur: NEGATIVE mg/dL
LEUKOCYTES UA: NEGATIVE
Nitrite: NEGATIVE
PROTEIN: NEGATIVE mg/dL
Specific Gravity, Urine: 1.016 (ref 1.005–1.030)
pH: 7 (ref 5.0–8.0)

## 2016-07-30 LAB — CBC
HCT: 41.5 % (ref 36.0–46.0)
HEMOGLOBIN: 13.4 g/dL (ref 12.0–15.0)
MCH: 24.2 pg — ABNORMAL LOW (ref 26.0–34.0)
MCHC: 32.3 g/dL (ref 30.0–36.0)
MCV: 74.9 fL — ABNORMAL LOW (ref 78.0–100.0)
PLATELETS: 325 10*3/uL (ref 150–400)
RBC: 5.54 MIL/uL — AB (ref 3.87–5.11)
RDW: 15 % (ref 11.5–15.5)
WBC: 12.2 10*3/uL — AB (ref 4.0–10.5)

## 2016-07-30 LAB — LIPASE, BLOOD: LIPASE: 21 U/L (ref 11–51)

## 2016-07-30 MED ORDER — ONDANSETRON 4 MG PO TBDP: 4.0000 mg | ORAL_TABLET | Freq: Three times a day (TID) | ORAL | 0 refills | Status: DC | PRN

## 2016-07-30 MED ORDER — ONDANSETRON HCL 4 MG/2ML IJ SOLN
4.0000 mg | Freq: Once | INTRAMUSCULAR | Status: AC
  Administered 2016-07-30: 4 mg via INTRAVENOUS
  Filled 2016-07-30: qty 2

## 2016-07-30 MED ORDER — IOPAMIDOL (ISOVUE-300) INJECTION 61%
100.0000 mL | Freq: Once | INTRAVENOUS | Status: AC | PRN
  Administered 2016-07-30: 100 mL via INTRAVENOUS

## 2016-07-30 MED ORDER — SODIUM CHLORIDE 0.9 % IV BOLUS (SEPSIS)
1000.0000 mL | Freq: Once | INTRAVENOUS | Status: AC
  Administered 2016-07-30: 1000 mL via INTRAVENOUS

## 2016-07-30 MED ORDER — IOPAMIDOL (ISOVUE-300) INJECTION 61%
INTRAVENOUS | Status: AC
  Administered 2016-07-30: 100 mL via INTRAVENOUS
  Filled 2016-07-30: qty 100

## 2016-07-30 NOTE — ED Triage Notes (Signed)
Pt states she started having abd pain, nausea, and vomiting around 9pm and has been vomiting since  Pt states she has vomiting ever since  Pt is dry heaving in triage  Pt states she has not had diarrhea but has had some loose stools

## 2016-07-30 NOTE — ED Provider Notes (Signed)
[facility] DEPT Provider Note   CSN: [id number] Arrival date & time: 07/30/16  0107     History   Chief Complaint Chief Complaint  Patient presents with  . Abdominal Pain  . Emesis    HPI Katelyn Cobb is a 31 y.o. female with a hx of Lymphoma (in remission since 2005), hypertension, sickle cell trait presents to the Emergency Department complaining of gradual, persistent, progressively worsening right upper quadrant abdominal pain with associated nausea and vomiting onset 7 PM tonight.  Patient reports she developed nausea and vomiting 2 hours after the development of abdominal pain. She reports persistent vomiting and was found to be dry heaving in triage. She reports several loose stools throughout evening but denies watery diarrhea. Patient denies recent antibiotics or international travel. She denies fevers or chills. She reports associated fatigue and generalized weakness. She denies night sweats or weight loss. She denies additional abdominal pain. Nothing makes the symptoms better or worse. No treatments prior to arrival.    The history is provided by the patient and medical records. No language interpreter was used.  Emesis   Associated symptoms include abdominal pain. Pertinent negatives include no cough, no fever and no headaches.    Past Medical History:  Diagnosis Date  . Cancer ([place])    lymphomoa  . Hypertension   . Lymphoma in remission 01/13/2011  . Lymphoma, Hodgkin's ([place]) 2006   in remission  . Sickle cell trait [facility])     Patient Active Problem List   Diagnosis Date Noted  . Cancer ([place])   . SVD (spontaneous vaginal delivery) 04/04/2011  . Iron deficiency anemia 01/22/2011  . Lymphoma in remission 01/13/2011  . 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 Sickle cell trait ([place]) 10/06/2010    Past Surgical History:  Procedure Laterality Date  . BREAST REDUCTION SURGERY    . LYMPH NODE BIOPSY  2005  . LYMPH NODE BIOPSY  2005   neck  . PORTACATH PLACEMENT   2006   for chemo treatment for lymphoma    OB History    Gravida Para Term Preterm AB Living   3 2 2  0 1 1   SAB TAB Ectopic Multiple Live Births   1 0 0 0         Home Medications    Prior to Admission medications   Medication Sig Start Date End Date Taking? Authorizing Provider  levothyroxine (SYNTHROID, LEVOTHROID) 50 MCG tablet Take 50 mcg by mouth daily. 06/22/16 06/22/17 Yes [provider]  lisinopril-hydrochlorothiazide (PRINZIDE,ZESTORETIC) 10-12.5 MG tablet Take 1 tablet by mouth daily. 11/10/15 11/09/16 Yes [provider]    Family History Family History  Problem Relation Age of Onset  . Diabetes Other   . Hypertension Other     Social History Social History  Substance Use Topics  . Smoking status: Never Smoker  . Smokeless tobacco: Never Used  . Alcohol use No     Allergies   Patient has no known allergies.   Review of Systems Review of Systems  Constitutional: Negative for fever.  HENT: Negative for congestion.   Respiratory: Negative for cough and shortness of breath.   Cardiovascular: Negative for chest pain.  Gastrointestinal: Positive for abdominal pain, nausea and vomiting.  Endocrine: Negative for polydipsia, polyphagia and polyuria.  Genitourinary: Negative for dysuria, flank pain and hematuria.  Musculoskeletal: Negative for back pain.  Skin: Negative for rash.  Neurological: Negative for headaches.  All other systems reviewed and are negative.    Physical  Exam Updated Vital Signs BP (!) 134/95   Pulse 98   Temp 97.5 F (36.4 C) (Oral)   Resp 20   LMP 06/30/2016 (Approximate) Comment: negative beta HCG 07/30/16  SpO2 100%   Physical Exam  Constitutional: She appears well-developed and well-nourished. No distress.  Awake, alert, nontoxic appearance  HENT:  Head: Normocephalic and atraumatic.  Mouth/Throat: Oropharynx is clear and moist. No oropharyngeal exudate.  Eyes: Conjunctivae are normal. No scleral  icterus.  Neck: Normal range of motion. Neck supple.  Cardiovascular: Normal rate, regular rhythm and intact distal pulses.   Pulmonary/Chest: Effort normal and breath sounds normal. No respiratory distress. She has no wheezes.  Equal chest expansion  Abdominal: Soft. Bowel sounds are normal. She exhibits no distension and no mass. There is tenderness in the right upper quadrant. There is guarding and positive Murphy's sign. There is no rigidity, no rebound and no CVA tenderness.  Musculoskeletal: Normal range of motion. She exhibits no edema.  Neurological: She is alert.  Speech is clear and goal oriented Moves extremities without ataxia  Skin: Skin is warm and dry. She is not diaphoretic.  Psychiatric: She has a normal mood and affect.  Nursing note and vitals reviewed.    ED Treatments / Results  Labs (all labs ordered are listed, but only abnormal results are displayed) Labs Reviewed  COMPREHENSIVE METABOLIC PANEL - Abnormal; Notable for the following:       Result Value   Glucose, Bld 164 (*)    All other components within normal limits  CBC - Abnormal; Notable for the following:    WBC 12.2 (*)    RBC 5.54 (*)    MCV 74.9 (*)    MCH 24.2 (*)    All other components within normal limits  URINALYSIS, ROUTINE W REFLEX MICROSCOPIC - Abnormal; Notable for the following:    APPearance HAZY (*)    All other components within normal limits  LIPASE, BLOOD  I-STAT BETA HCG BLOOD, ED ([facility], [facility], [facility] ONLY)    Radiology US Abdomen Complete  Result Date: 07/30/2016 CLINICAL DATA:  Right upper quadrant abdominal pain.  Vomiting. EXAM: ABDOMEN ULTRASOUND COMPLETE COMPARISON:  None. FINDINGS: Gallbladder: Physiologically distended. No gallstones or wall thickening visualized. Fold about the gallbladder fundus. No sonographic Murphy sign noted by sonographer. Common bile duct: Diameter: 3 mm. Liver: No focal lesion identified. Within normal limits in parenchymal echogenicity. Normal  directional flow in the main portal vein. Trace fluid in the hepatic renal space. IVC: No abnormality visualized. Pancreas: Visualized portion unremarkable. Spleen: Size and appearance within normal limits. Majority of the spleen is obscured. Right Kidney: Length: 10.9 cm. Echogenicity within normal limits. No mass or hydronephrosis visualized. Trace fluid in the pattern renal space. Left Kidney: Length: 10.6 cm. Echogenicity within normal limits. No mass or hydronephrosis visualized. Abdominal aorta: No aneurysm visualized. Other findings: None. IMPRESSION: 1. Trace free fluid in the right hepatorenal space. This is highly nonspecific, and can be seen with liver, kidney, or other bowel inflammation. The liver and kidney otherwise have an unremarkable sonographic appearance. 2. No additional acute abnormality. Normal sonographic appearance of the gallbladder. Electronically Signed   By: Jeb Levering M.D.   On: 07/30/2016 05:15      Procedures Procedures (including critical care time)  Medications Ordered in ED Medications  ondansetron (ZOFRAN) injection 4 mg (4 mg Intravenous Given 07/30/16 0412)  sodium chloride 0.9 % bolus 1,000 mL (1,000 mLs Intravenous New Bag/Given 07/30/16 0413)  iopamidol (ISOVUE-300) 61 %  injection 100 mL (100 mLs Intravenous Contrast Given 07/30/16 1102)     Initial Impression / Assessment and Plan / ED Course  I have reviewed the triage vital signs and the nursing notes.  Pertinent labs & imaging results that were available during my care of the patient were reviewed by me and considered in my medical decision making (see chart for details).     Patient presents with right upper quadrant abdominal pain, positive Murphy's sign and associated nausea and vomiting. Patient has leukocytosis but no elevation in AST, ALT or lipase. She denies history of pancreatitis or alcohol usage.  Ultrasound shows normal common bile duct and physiologically distended gallbladder  without gallstones or wall thickening. There is trace fluid in the hepatic renal space. Patient denies recent trauma however concern for possible injury versus nonvisualized gallstone remains. Will obtain CT scan.  7:25 AM Care transferred to Avie Echevaria, PA-C who will follow imaging, reassess, PO trial and disposition accordingly.    Final Clinical Impressions(s) / ED Diagnoses   Final diagnoses:  Non-intractable vomiting, presence of nausea not specified, unspecified vomiting type    New Prescriptions New Prescriptions   No medications on file     Agapito Games 07/30/16 [place], April, MD 07/30/16 [phone]

## 2016-07-30 NOTE — ED Provider Notes (Signed)
Received patient care transfer at end of shift from [facility], PA-C  Pending CT abdomen with plan to discharge home with symptomatic after fluid challenge if CT negative.  Ct showing mild ascites. Patient discussed with Dr. Randal Buba. Will discharge home with GI follow up and symptomatic relief.    Emeline General, PA-C 07/30/16 1027    Palumbo, April, MD 08/02/16 [phone]

## 2016-07-30 NOTE — Discharge Instructions (Signed)
As discussed, please follow up with the gastroenterologist at [facility].  Return to the ER if symptoms worsen or if you experience new concerning symptoms.

## 2016-08-04 ENCOUNTER — Ambulatory Visit (INDEPENDENT_AMBULATORY_CARE_PROVIDER_SITE_OTHER): Payer: BC Managed Care – PPO | Admitting: Gastroenterology

## 2016-08-04 ENCOUNTER — Encounter: Payer: Self-pay | Admitting: Gastroenterology

## 2016-08-04 VITALS — Ht 63.0 in | Wt 172.9 lb

## 2016-08-04 DIAGNOSIS — R933 Abnormal findings on diagnostic imaging of other parts of digestive tract: Secondary | ICD-10-CM | POA: Insufficient documentation

## 2016-08-04 NOTE — Patient Instructions (Signed)
You have been scheduled for an Upper Gi and  small bowel follow thru at [facility] Radiology department. Your appointment is on Wednesday 6-6 at 9:00 am. Please arrive at 8:45 am  to your test for registration. Make certain not to have anything to eat or drink starting Midnight on the night before your test. If for some reason you need to reschedule your test, please call radiology at [phone]. ____________________________________________________________________ The Small Bowel Follow Thru examination is used to visualize the entire small bowel (intestines); specifically the connection between the small and large intestine. You will be positioned on a flat x-ray table and an image of your abdomen taken. Then the technologist will show the x-ray to the radiologist. The radiologist will instruct your technologist how much (1-2 cups) barium sulfate you will drink and when to begin taking the timed x-rays, usually 15-30 minutes after you begin drinking. Barium is a harmless substance that will highlight your small intestine by absorbing x-ray. The taste is chalky and it feels very heavy both in the cup and in your stomach.  After the first x-ray is taken and shown to the radiologist, he/she will determine when the next image is to be taken. This is repeated until the barium has reached the end of the small intestine and enters the beginning of the colon (cecum). At such time when the barium spills into the colon, you will be positioned on the x-ray table once again. The radiologist will use a fluoroscopic camera to take some detailed pictures of the connection between your small intestine and colon. The fluoroscope is an x-ray unit that works with a television/computer screen. The radiologist will apply pressure to your abdomen with his/her hand and a lead glove, a plastic paddle, or a paddle with an inflated rubber balloon on the end. This is to spread apart your loops of intestine so he/she can see all areas.    This test typically takes around 1 hour to complete.  **Important** Drink plenty of water (8-10 cups/day) for a few days following the procedure to avoid constipation and blockage. The barium will make your stools white for a few days. ____________________________________________________________________

## 2016-08-04 NOTE — Progress Notes (Addendum)
08/04/2016 [place] [id number] [birth year]   HISTORY OF PRESENT ILLNESS:  This is a pleasant 31 year old female who is new to our practice.  She is here for an ER follow up, referred by Avie Echevaria, PA-C. She was in the emergency department 5 days ago when she woke up with sudden onset of upper abdominal pain and vomiting. She says that she had some loose stool, but no significant diarrhea. A day prior to her onset of symptoms she was feeling completely normal. She presented to the emergency department where labs revealed a mildly elevated white blood cell count at 12.2. Lipase and CMP unremarkable. CT scan of the abdomen and pelvis with contrast showed wall thickening of much of the proximal and mid small bowel compatible with enteritis of uncertain etiology. There was associated small amount of ascites as well. She says that she was given Zofran and by the time she is finished in the ER she had started feeling somewhat better. She says that she has continued to improve since that time. Her symptoms are about resolved. She does not have any further abdominal pain. Minimal nausea and has not needed the Zofran. Is eating a normal diet. Stools are back to normal.  Of note, she did have Hodgkin's lymphoma that was diagnosed when she was 31 years old.   Past Medical History:  Diagnosis Date  . Cancer ([place])    lymphomoa  . Hypertension   . Lymphoma in remission 01/13/2011  . Lymphoma, Hodgkin's ([place]) 2006   in remission  . Sickle cell trait [facility])    Past Surgical History:  Procedure Laterality Date  . BREAST REDUCTION SURGERY    . LYMPH NODE BIOPSY  2005  . LYMPH NODE BIOPSY  2005   neck  . PORTACATH PLACEMENT  2006   for chemo treatment for lymphoma    reports that she has never smoked. She has never used smokeless tobacco. She reports that she does not drink alcohol or use drugs. family history includes Diabetes in her other; Hypertension in her other. No Known  Allergies    Outpatient Encounter Prescriptions as of 08/04/2016  Medication Sig  . levothyroxine (SYNTHROID, LEVOTHROID) 50 MCG tablet Take 50 mcg by mouth daily.  Marland Kitchen lisinopril-hydrochlorothiazide (PRINZIDE,ZESTORETIC) 10-12.5 MG tablet Take 1 tablet by mouth daily.  . ondansetron (ZOFRAN ODT) 4 MG disintegrating tablet Take 1 tablet (4 mg total) by mouth every 8 (eight) hours as needed for nausea or vomiting.   No facility-administered encounter medications on file as of 08/04/2016.      REVIEW OF SYSTEMS  : All other systems reviewed and negative except where noted in the History of Present Illness.   PHYSICAL EXAM: Ht 5\' 3"  (1.6 m)   Wt 172 lb 14.4 oz (78.4 kg)   LMP 07/05/2016 (Exact Date)   BMI 30.63 kg/m  General: Well developed black female in no acute distress Head: Normocephalic and atraumatic Eyes:  Sclerae anicteric, conjunctiva pink. Ears: Normal auditory acuity Lungs: Clear throughout to auscultation Heart: Regular rate and rhythm Abdomen: Soft, non-distended. Normal bowel sounds.  Non-tender. Musculoskeletal: Symmetrical with no gross deformities  Skin: No lesions on visible extremities Extremities: No edema  Neurological: Alert oriented x 4, grossly non-focal Psychological:  Alert and cooperative. Normal mood and affect  ASSESSMENT AND PLAN: -31 year old female with recent episode of acute onset of vomiting and abdominal pain with some loose stools.  CT scan showed wall thickening of much of the proximal and mid small  bowel compatible enteritis. Suspect that this is infectious process. Her symptoms are now about completely resolved at this point. Due to her history of lymphoma I think we should just follow this up with a repeat imaging study in a few weeks to be sure that abnormality has resolved. We'll schedule upper GI with small bowel follow-through.   CC:  Leamon Arnt, MD  Addendum: Reviewed and agree with initial management. Pyrtle, Lajuan Lines, MD

## 2016-08-18 ENCOUNTER — Telehealth: Payer: Self-pay | Admitting: Gastroenterology

## 2016-08-18 ENCOUNTER — Ambulatory Visit (HOSPITAL_COMMUNITY)
Admission: RE | Admit: 2016-08-18 | Discharge: 2016-08-18 | Disposition: A | Discharge status: Home / Self Care | Payer: BC Managed Care – PPO | Source: Ambulatory Visit | Attending: Gastroenterology | Admitting: Gastroenterology

## 2016-08-18 DIAGNOSIS — R933 Abnormal findings on diagnostic imaging of other parts of digestive tract: Secondary | ICD-10-CM | POA: Insufficient documentation

## 2016-08-18 NOTE — Telephone Encounter (Signed)
Patient returning Linda's call about results from today 6.6.18

## 2016-08-18 NOTE — Telephone Encounter (Signed)
See result note.  

## 2017-01-14 ENCOUNTER — Ambulatory Visit (INDEPENDENT_AMBULATORY_CARE_PROVIDER_SITE_OTHER): Payer: BC Managed Care – PPO | Admitting: Family Medicine

## 2017-01-14 ENCOUNTER — Encounter: Payer: Self-pay | Admitting: Family Medicine

## 2017-01-14 VITALS — BP 136/80 | HR 95 | Temp 98.1°F | Ht 62.0 in | Wt 167.5 lb

## 2017-01-14 DIAGNOSIS — Z7689 Persons encountering health services in other specified circumstances: Secondary | ICD-10-CM

## 2017-01-14 DIAGNOSIS — I10 Essential (primary) hypertension: Secondary | ICD-10-CM

## 2017-01-14 DIAGNOSIS — L308 Other specified dermatitis: Secondary | ICD-10-CM

## 2017-01-14 DIAGNOSIS — R739 Hyperglycemia, unspecified: Secondary | ICD-10-CM

## 2017-01-14 DIAGNOSIS — Z23 Encounter for immunization: Secondary | ICD-10-CM

## 2017-01-14 DIAGNOSIS — E038 Other specified hypothyroidism: Secondary | ICD-10-CM

## 2017-01-14 LAB — COMPREHENSIVE METABOLIC PANEL
ALT: 12 U/L (ref 0–35)
AST: 14 U/L (ref 0–37)
Albumin: 4 g/dL (ref 3.5–5.2)
Alkaline Phosphatase: 51 U/L (ref 39–117)
BILIRUBIN TOTAL: 0.3 mg/dL (ref 0.2–1.2)
BUN: 7 mg/dL (ref 6–23)
CALCIUM: 9.3 mg/dL (ref 8.4–10.5)
CHLORIDE: 106 meq/L (ref 96–112)
CO2: 30 meq/L (ref 19–32)
CREATININE: 0.65 mg/dL (ref 0.40–1.20)
GFR: 134.6 mL/min (ref >60.00)
GLUCOSE: 124 mg/dL — AB (ref 70–99)
Potassium: 4.2 mEq/L (ref 3.5–5.1)
SODIUM: 141 meq/L (ref 135–145)
Total Protein: 7.6 g/dL (ref 6.0–8.3)

## 2017-01-14 LAB — CBC WITH DIFFERENTIAL/PLATELET
BASOS ABS: 0 10*3/uL (ref 0.0–0.1)
BASOS PCT: 0.3 % (ref 0.0–3.0)
EOS ABS: 0.2 10*3/uL (ref 0.0–0.7)
Eosinophils Relative: 4.2 % (ref 0.0–5.0)
HCT: 35.6 % — ABNORMAL LOW (ref 36.0–46.0)
Hemoglobin: 11.1 g/dL — ABNORMAL LOW (ref 12.0–15.0)
LYMPHS PCT: 41.3 % (ref 12.0–46.0)
Lymphs Abs: 1.5 10*3/uL (ref 0.7–4.0)
MCHC: 31.2 g/dL (ref 30.0–36.0)
MCV: 77.8 fl — ABNORMAL LOW (ref 78.0–100.0)
MONO ABS: 0.3 10*3/uL (ref 0.1–1.0)
Monocytes Relative: 8.8 % (ref 3.0–12.0)
NEUTROS ABS: 1.7 10*3/uL (ref 1.4–7.7)
NEUTROS PCT: 45.4 % (ref 43.0–77.0)
PLATELETS: 300 10*3/uL (ref 150.0–400.0)
RBC: 4.58 Mil/uL (ref 3.87–5.11)
RDW: 17.2 % — AB (ref 11.5–15.5)
WBC: 3.7 10*3/uL — AB (ref 4.0–10.5)

## 2017-01-14 LAB — LIPID PANEL
CHOL/HDL RATIO: 2
Cholesterol: 110 mg/dL (ref 0–200)
HDL: 44.3 mg/dL (ref >39.00)
LDL CALC: 56 mg/dL (ref 0–99)
NONHDL: 65.66
Triglycerides: 47 mg/dL (ref 0.0–149.0)
VLDL: 9.4 mg/dL (ref 0.0–40.0)

## 2017-01-14 LAB — TSH: TSH: 3.75 u[IU]/mL (ref 0.35–4.50)

## 2017-01-14 LAB — HEMOGLOBIN A1C: Hgb A1c MFr Bld: 5.5 % (ref 4.6–6.5)

## 2017-01-14 MED ORDER — TRIAMCINOLONE ACETONIDE 0.1 % EX CREA: 1.0000 "application " | TOPICAL_CREAM | Freq: Two times a day (BID) | CUTANEOUS | 1 refills | Status: DC

## 2017-01-14 NOTE — Patient Instructions (Signed)
It was a pleasure to meet you today! I look forward to partnering with you for your health care needs  I will notify you of your lab results  Please consider signing up for your Mychart account

## 2017-01-14 NOTE — Progress Notes (Signed)
Subjective:    Patient ID: Katelyn Cobb, female    DOB: 1985/12/09, 31 y.o.   MRN: [id number]  HPI This is a 31 yo female who presents today to establish care. She has a 24 yo son and 57 yo daughter. 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 She loves movies, readings, going to the park with her kids.   Last PAP- this year- nml, Dr. Corinna Capra Not currently sexually active. Not dating.    Has had rash on arms and behind knees that started earlier this year. Itchy, not getting bigger. Was told it was eczema. Has been applying Aquaphor without improvement.   History of lymphoma at age 82. Had chemo and radiation. Does not follow with oncology.   Hypothyroidism following radiation to neck/chest. Has difficulty remembering to take her levothyroxine daily.    Past Medical History:  Diagnosis Date  . Cancer ([place])    lymphomoa  . Hypertension   . Lymphoma in remission ([place]) 01/13/2011  . Lymphoma, Hodgkin's ([place]) 2006   in remission  . Sickle cell trait [facility])    Past Surgical History:  Procedure Laterality Date  . BREAST REDUCTION SURGERY    . LYMPH NODE BIOPSY  2005  . LYMPH NODE BIOPSY  2005   neck  . PORTACATH PLACEMENT  2006   for chemo treatment for lymphoma   Family History  Problem Relation Age of Onset  . Diabetes Other   . Hypertension Other    Social History  Substance Use Topics  . Smoking status: Never Smoker  . Smokeless tobacco: Never Used  . Alcohol use No      Review of Systems  Constitutional: Negative for fatigue, fever and unexpected weight change.  HENT: Negative.   Respiratory: Negative for cough and shortness of breath.   Cardiovascular: Negative for chest pain, palpitations and leg swelling.  Gastrointestinal: Negative for abdominal pain, constipation, diarrhea, nausea and vomiting.  Skin: Positive for rash.  Neurological: Negative for headaches.  Psychiatric/Behavioral: Negative for dysphoric mood and sleep disturbance. The patient is not  nervous/anxious.        Objective:   Physical Exam  Constitutional: She is oriented to person, place, and time. She appears well-developed and well-nourished. No distress.  HENT:  Head: Normocephalic and atraumatic.  Eyes: Conjunctivae are normal.  Cardiovascular: Normal rate, regular rhythm and normal heart sounds.   Pulmonary/Chest: Effort normal and breath sounds normal.  Musculoskeletal: She exhibits no edema.  Neurological: She is alert and oriented to person, place, and time.  Skin: Skin is warm and dry. Rash (Large, slightly raised, dry area of hyperpigmentation on right forearm. Smaller, scattered areas on bilateral forearms. ) noted. She is not diaphoretic.  Psychiatric: She has a normal mood and affect. Her behavior is normal. Judgment and thought content normal.  Vitals reviewed.     BP 136/80 (BP Location: Right Arm, Patient Position: Sitting, Cuff Size: Normal)   Pulse 95   Temp 98.1 F (36.7 C) (Oral)   Ht 5\' 2"  (1.575 m)   Wt 167 lb 8 oz (76 kg)   LMP 12/15/2016   SpO2 97%   BMI 30.64 kg/m  Wt Readings from Last 3 Encounters:  01/14/17 167 lb 8 oz (76 kg)  08/04/16 172 lb 14.4 oz (78.4 kg)  11/24/12 171 lb (77.6 kg)       Assessment & Plan:  1. Encounter to establish care - Discussed and encouraged healthy lifestyle choices- adequate sleep, regular exercise, stress management and  healthy food choices.    2. Essential hypertension - controlled on current med - CBC with Differential/Platelet - Comprehensive metabolic panel - Lipid panel  3. Other specified hypothyroidism - discussed importance of taking levothyroxine daily and discussed ways to improve compliance - TSH  4. Need for pneumococcal vaccination - had Prevnar 13 greater than one year ago - Pneumococcal polysaccharide vaccine 23-valent greater than or equal to 2yo subcutaneous/IM  5. Flu vaccine need - Flu Vaccine QUAD 36+ mos IM  6. Elevated blood sugar - Hemoglobin A1c  7. Other  eczema - triamcinolone cream (KENALOG) 0.1 %; Apply 1 application topically 2 (two) times daily. For maximum of 10 days, then take a break.  Dispense: 45 g; Refill: 1 - Ambulatory referral to Dermatology   Clarene Reamer, FNP-BC  [facility]  01/14/2017 2:01 PM

## 2017-01-18 ENCOUNTER — Other Ambulatory Visit: Payer: Self-pay | Admitting: Family Medicine

## 2017-01-18 DIAGNOSIS — D649 Anemia, unspecified: Secondary | ICD-10-CM

## 2017-06-17 ENCOUNTER — Observation Stay
Admission: EM | Admit: 2017-06-17 | Discharge: 2017-06-19 | Disposition: A | Discharge status: Home / Self Care | Payer: BC Managed Care – PPO | Attending: Internal Medicine | Admitting: Internal Medicine

## 2017-06-17 ENCOUNTER — Encounter: Payer: Self-pay | Admitting: Emergency Medicine

## 2017-06-17 ENCOUNTER — Ambulatory Visit: Payer: Self-pay

## 2017-06-17 DIAGNOSIS — Y92009 Unspecified place in unspecified non-institutional (private) residence as the place of occurrence of the external cause: Secondary | ICD-10-CM | POA: Insufficient documentation

## 2017-06-17 DIAGNOSIS — E039 Hypothyroidism, unspecified: Secondary | ICD-10-CM | POA: Diagnosis present

## 2017-06-17 DIAGNOSIS — D573 Sickle-cell trait: Secondary | ICD-10-CM | POA: Insufficient documentation

## 2017-06-17 DIAGNOSIS — I10 Essential (primary) hypertension: Secondary | ICD-10-CM | POA: Diagnosis present

## 2017-06-17 DIAGNOSIS — L03113 Cellulitis of right upper limb: Secondary | ICD-10-CM | POA: Diagnosis not present

## 2017-06-17 DIAGNOSIS — Z8571 Personal history of Hodgkin lymphoma: Secondary | ICD-10-CM | POA: Diagnosis not present

## 2017-06-17 DIAGNOSIS — T783XXA Angioneurotic edema, initial encounter: Secondary | ICD-10-CM | POA: Diagnosis not present

## 2017-06-17 DIAGNOSIS — Z79899 Other long term (current) drug therapy: Secondary | ICD-10-CM | POA: Insufficient documentation

## 2017-06-17 DIAGNOSIS — T464X5A Adverse effect of angiotensin-converting-enzyme inhibitors, initial encounter: Secondary | ICD-10-CM | POA: Insufficient documentation

## 2017-06-17 DIAGNOSIS — Z888 Allergy status to other drugs, medicaments and biological substances status: Secondary | ICD-10-CM | POA: Insufficient documentation

## 2017-06-17 LAB — CBC
HCT: 36 % (ref 35.0–47.0)
HEMOGLOBIN: 11.4 g/dL — AB (ref 12.0–16.0)
MCH: 23.2 pg — ABNORMAL LOW (ref 26.0–34.0)
MCHC: 31.7 g/dL — ABNORMAL LOW (ref 32.0–36.0)
MCV: 73.2 fL — AB (ref 80.0–100.0)
Platelets: 341 10*3/uL (ref 150–440)
RBC: 4.92 MIL/uL (ref 3.80–5.20)
RDW: 16.7 % — AB (ref 11.5–14.5)
WBC: 6.9 10*3/uL (ref 3.6–11.0)

## 2017-06-17 LAB — COMPREHENSIVE METABOLIC PANEL
ALBUMIN: 4 g/dL (ref 3.5–5.0)
ALT: 19 U/L (ref 14–54)
AST: 26 U/L (ref 15–41)
Alkaline Phosphatase: 54 U/L (ref 38–126)
Anion gap: 8 (ref 5–15)
BUN: 10 mg/dL (ref 6–20)
CHLORIDE: 105 mmol/L (ref 101–111)
CO2: 26 mmol/L (ref 22–32)
Calcium: 9.1 mg/dL (ref 8.9–10.3)
Creatinine, Ser: 0.72 mg/dL (ref 0.44–1.00)
GFR calc Af Amer: >60 mL/min (ref >60)
GFR calc non Af Amer: >60 mL/min (ref >60)
GLUCOSE: 132 mg/dL — AB (ref 65–99)
POTASSIUM: 3.5 mmol/L (ref 3.5–5.1)
SODIUM: 139 mmol/L (ref 135–145)
Total Bilirubin: 0.6 mg/dL (ref 0.3–1.2)
Total Protein: 8 g/dL (ref 6.5–8.1)

## 2017-06-17 MED ORDER — PREDNISONE 20 MG PO TABS
10.0000 mg | ORAL_TABLET | Freq: Every day | ORAL | Status: DC
Start: 2017-06-22 — End: 2017-06-18

## 2017-06-17 MED ORDER — PREDNISONE 20 MG PO TABS: 20.0000 mg | ORAL_TABLET | Freq: Every day | ORAL | Status: DC

## 2017-06-17 MED ORDER — FAMOTIDINE IN NACL 20-0.9 MG/50ML-% IV SOLN
20.0000 mg | Freq: Once | INTRAVENOUS | Status: AC
  Administered 2017-06-17: 20 mg via INTRAVENOUS
  Filled 2017-06-17: qty 50

## 2017-06-17 MED ORDER — ACETAMINOPHEN 325 MG PO TABS: 650.0000 mg | ORAL_TABLET | Freq: Four times a day (QID) | ORAL | Status: DC | PRN

## 2017-06-17 MED ORDER — ENOXAPARIN SODIUM 40 MG/0.4ML ~~LOC~~ SOLN
40.0000 mg | SUBCUTANEOUS | Status: DC
  Administered 2017-06-18: 40 mg via SUBCUTANEOUS
  Filled 2017-06-17: qty 0.4

## 2017-06-17 MED ORDER — PREDNISONE 20 MG PO TABS
40.0000 mg | ORAL_TABLET | Freq: Every day | ORAL | Status: DC
  Administered 2017-06-18: 40 mg via ORAL
  Filled 2017-06-17: qty 2

## 2017-06-17 MED ORDER — LABETALOL HCL 5 MG/ML IV SOLN: 10.0000 mg | INTRAVENOUS | Status: DC | PRN

## 2017-06-17 MED ORDER — ONDANSETRON HCL 4 MG PO TABS: 4.0000 mg | ORAL_TABLET | Freq: Four times a day (QID) | ORAL | Status: DC | PRN

## 2017-06-17 MED ORDER — DIPHENHYDRAMINE HCL 50 MG/ML IJ SOLN
25.0000 mg | Freq: Four times a day (QID) | INTRAMUSCULAR | Status: AC
Start: 2017-06-18 — End: 2017-06-18
  Administered 2017-06-18 (×3): 25 mg via INTRAVENOUS
  Filled 2017-06-17 (×3): qty 1

## 2017-06-17 MED ORDER — ACETAMINOPHEN 650 MG RE SUPP: 650.0000 mg | Freq: Four times a day (QID) | RECTAL | Status: DC | PRN

## 2017-06-17 MED ORDER — LEVOTHYROXINE SODIUM 50 MCG PO TABS
75.0000 ug | ORAL_TABLET | Freq: Every day | ORAL | Status: DC
  Administered 2017-06-18 – 2017-06-19 (×2): 75 ug via ORAL
  Filled 2017-06-17 (×2): qty 2

## 2017-06-17 MED ORDER — ONDANSETRON HCL 4 MG/2ML IJ SOLN: 4.0000 mg | Freq: Four times a day (QID) | INTRAMUSCULAR | Status: DC | PRN

## 2017-06-17 MED ORDER — LORATADINE 10 MG PO TABS
10.0000 mg | ORAL_TABLET | Freq: Two times a day (BID) | ORAL | Status: DC
  Administered 2017-06-17 – 2017-06-18 (×3): 10 mg via ORAL
  Filled 2017-06-17 (×3): qty 1

## 2017-06-17 MED ORDER — SODIUM CHLORIDE 0.9 % IV BOLUS
1000.0000 mL | Freq: Once | INTRAVENOUS | Status: AC
  Administered 2017-06-17: 1000 mL via INTRAVENOUS

## 2017-06-17 NOTE — H&P (Signed)
[facility] [place] NAME: Katelyn Cobb    MR#:  [id number]  DATE OF BIRTH:  [birth year]  DATE OF ADMISSION:  06/17/2017  PRIMARY CARE PHYSICIAN: Elby Beck, FNP   REQUESTING/REFERRING PHYSICIAN: Joni Fears, MD  CHIEF COMPLAINT:   Chief Complaint  Patient presents with  . Angioedema    HISTORY OF PRESENT ILLNESS:  Katelyn Cobb  is a 32 y.o. female who presents with anemic and lip swelling.  Patient states that she noticed this this morning shortly after she took her dose of lisinopril.  Patient reports that she had been on lisinopril in the past for hypertension, and then was taken off of it for a while when her blood pressure was better controlled.  She just restarted this medication today, and afterwards developed the angioedema.  She does not have any pharyngeal or throat involvement, no shortness of breath.  Hospitalist were called for admission  PAST MEDICAL HISTORY:   Past Medical History:  Diagnosis Date  . Cancer ([place])    lymphomoa  . Hypertension   . Lymphoma in remission ([place]) 01/13/2011  . Lymphoma, Hodgkin's ([place]) 2006   in remission  . Sickle cell trait ([place])      PAST SURGICAL HISTORY:   Past Surgical History:  Procedure Laterality Date  . BREAST REDUCTION SURGERY    . LYMPH NODE BIOPSY  2005  . LYMPH NODE BIOPSY  2005   neck  . PORTACATH PLACEMENT  2006   for chemo treatment for lymphoma     SOCIAL HISTORY:   Social History   Tobacco Use  . Smoking status: Never Smoker  . Smokeless tobacco: Never Used  Substance Use Topics  . Alcohol use: No     FAMILY HISTORY:   Family History  Problem Relation Age of Onset  . Diabetes Other   . Hypertension Other      DRUG ALLERGIES:   Allergies  Allergen Reactions  . Lisinopril Swelling    MEDICATIONS AT HOME:   Prior to Admission medications   Medication Sig Start Date End Date Taking? Authorizing Provider  levothyroxine  (SYNTHROID, LEVOTHROID) 75 MCG tablet Take by mouth. 09/02/16 09/02/17  [provider]  triamcinolone cream (KENALOG) 0.1 % Apply 1 application topically 2 (two) times daily. For maximum of 10 days, then take a break. 01/14/17   Elby Beck, FNP    REVIEW OF SYSTEMS:  Review of Systems  Constitutional: Negative for chills, fever, malaise/fatigue and weight loss.  HENT: Negative for ear pain, hearing loss and tinnitus.        Lip swelling  Eyes: Negative for blurred vision, double vision, pain and redness.  Respiratory: Negative for cough, hemoptysis and shortness of breath.   Cardiovascular: Negative for chest pain, palpitations, orthopnea and leg swelling.  Gastrointestinal: Negative for abdominal pain, constipation, diarrhea, nausea and vomiting.  Genitourinary: Negative for dysuria, frequency and hematuria.  Musculoskeletal: Negative for back pain, joint pain and neck pain.  Skin:       No acne, rash, or lesions  Neurological: Negative for dizziness, tremors, focal weakness and weakness.  Endo/Heme/Allergies: Negative for polydipsia. Does not bruise/bleed easily.  Psychiatric/Behavioral: Negative for depression. The patient is not nervous/anxious and does not have insomnia.      VITAL SIGNS:   Vitals:   06/17/17 2045 06/17/17 2100 06/17/17 2115 06/17/17 2204  BP:    135/86  Pulse: 90 91 (!) 101 (!) 101  Resp: (!) 21 20  17 16  Temp:      TempSrc:      SpO2: 99% 97% 99% 99%  Weight:      Height:       Wt Readings from Last 3 Encounters:  06/17/17 75.3 kg (166 lb)  01/14/17 76 kg (167 lb 8 oz)  08/04/16 78.4 kg (172 lb 14.4 oz)    PHYSICAL EXAMINATION:  Physical Exam  Vitals reviewed. Constitutional: She is oriented to person, place, and time. She appears well-developed and well-nourished. No distress.  HENT:  Head: Normocephalic and atraumatic.  Mouth/Throat: Oropharynx is clear and moist.  Lip swelling  Eyes: Pupils are equal, round, and reactive to  light. Conjunctivae and EOM are normal. No scleral icterus.  Neck: Normal range of motion. Neck supple. No JVD present. No thyromegaly present.  Cardiovascular: Normal rate, regular rhythm and intact distal pulses. Exam reveals no gallop and no friction rub.  No murmur heard. Respiratory: Effort normal and breath sounds normal. No respiratory distress. She has no wheezes. She has no rales.  GI: Soft. Bowel sounds are normal. She exhibits no distension. There is no tenderness.  Musculoskeletal: Normal range of motion. She exhibits no edema.  No arthritis, no gout  Lymphadenopathy:    She has no cervical adenopathy.  Neurological: She is alert and oriented to person, place, and time. No cranial nerve deficit.  No dysarthria, no aphasia  Skin: Skin is warm and dry. No rash noted. No erythema.  Psychiatric: She has a normal mood and affect. Her behavior is normal. Judgment and thought content normal.    LABORATORY PANEL:   CBC Recent Labs  Lab 06/17/17 2204  WBC 6.9  HGB 11.4*  HCT 36.0  PLT 341   ------------------------------------------------------------------------------------------------------------------  Chemistries  No results for input(s): NA, K, CL, CO2, GLUCOSE, BUN, CREATININE, CALCIUM, MG, AST, ALT, ALKPHOS, BILITOT in the last 168 hours.  Invalid input(s): GFRCGP ------------------------------------------------------------------------------------------------------------------  Cardiac Enzymes No results for input(s): TROPONINI in the last 168 hours. ------------------------------------------------------------------------------------------------------------------  RADIOLOGY:  No results found.  EKG:  No orders found for this or any previous visit.  IMPRESSION AND PLAN:  Principal Problem:   Angioedema -currently no airway involvement, patient was given Solu-Medrol and Benadryl in the urgent care center before being sent to the ED.  Here she was given Pepcid.   We will continue her on a prednisone taper, and give her an H1 blocker as well.  Monitor closely for improvement Active Problems:   HTN (hypertension) -patient's blood pressure is currently stable and normotensive, will monitor for any need for antihypertensives   Hypothyroidism -home dose thyroid replacement  Chart review performed and case discussed with ED provider. Labs, imaging and/or ECG reviewed by provider and discussed with patient/family. Management plans discussed with the patient and/or family.  DVT PROPHYLAXIS: SubQ lovenox  GI PROPHYLAXIS: H2 blocker  ADMISSION STATUS: Observation  CODE STATUS: Full Code Status History    Date Active Date Inactive Code Status Order ID Comments User Context   04/03/2011 1204 04/05/2011 1726 Full Code [id number]  Joesph Fillers, RN Inpatient      TOTAL TIME TAKING CARE OF THIS PATIENT: 40 minutes.   Shalva Rozycki [place] 06/17/2017, 10:13 PM  [employer]  [phone]  CC: Primary care physician; Elby Beck, FNP  Note:  This document was prepared using Dragon voice recognition software and may include unintentional dictation errors.

## 2017-06-17 NOTE — ED Notes (Signed)
Pt able to talk without change in voice and denies swelling. No changes in lip noted. No difficulty breathing and no increased WOB noted. Pt is easily arousable.

## 2017-06-17 NOTE — Telephone Encounter (Signed)
Noted  

## 2017-06-17 NOTE — Telephone Encounter (Signed)
I spoke with pt and she is not going to ED now; pt is at work; pt has swelling on top lip; same as when spoke with nurse. No difficulty breathing or problems swallowing and no swelling at back of throat or tongue. Pt said she will go to UC at lunchtime and if condition worsens any she will go sooner to ED. FYI to Glenda Chroman FNP; pt established with Glenda Chroman FNP on 01/14/17.

## 2017-06-17 NOTE — ED Provider Notes (Signed)
[facility] Emergency Department Provider Note  ____________________________________________  Time seen: Approximately 7:08 PM  I have reviewed the triage vital signs and the nursing notes.   HISTORY  Chief Complaint Angioedema    HPI Katelyn Cobb is a 32 y.o. female with a history of lymphoma and hypertension sent to the ED from urgent care due to upper lip swelling. Started this morning, has been constant all day and worsening. No tongue swelling or throat swelling. Noted difficulty breathing or swallowing. Never had anything like this before. Started after she took her lisinopril. Also has a family member who is previously had a similar episode. symptoms are constant, moderate pain, no aggravating or alleviating factors. Received Solu-Medrol and Benadryl at urgent care without improvement.  no vomiting fever or other complaints at this time. She is taking Keflex for a cellulitis on her right arm.     Past Medical History:  Diagnosis Date  . Cancer ([place])    lymphomoa  . Hypertension   . Lymphoma in remission ([place]) 01/13/2011  . Lymphoma, Hodgkin's ([place]) 2006   in remission  . Sickle cell trait [facility])      Patient Active Problem List   Diagnosis Date Noted  . HTN (hypertension) 06/17/2017  . Angioedema 06/17/2017  . Abnormal CT scan, small bowel 08/04/2016  . Cancer ([place])   . SVD (spontaneous vaginal delivery) 04/04/2011  . Iron deficiency anemia 01/22/2011  . Lymphoma in remission ([place]) 01/13/2011  . 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 Sickle cell trait ([place]) 10/06/2010     Past Surgical History:  Procedure Laterality Date  . BREAST REDUCTION SURGERY    . LYMPH NODE BIOPSY  2005  . LYMPH NODE BIOPSY  2005   neck  . PORTACATH PLACEMENT  2006   for chemo treatment for lymphoma     Prior to Admission medications   Medication Sig Start Date End Date Taking? Authorizing Provider  levothyroxine (SYNTHROID, LEVOTHROID) 75 MCG tablet Take by  mouth. 09/02/16 09/02/17  [provider]  lisinopril-hydrochlorothiazide (PRINZIDE,ZESTORETIC) 10-12.5 MG tablet Take by mouth. 11/10/15   [provider]  triamcinolone cream (KENALOG) 0.1 % Apply 1 application topically 2 (two) times daily. For maximum of 10 days, then take a break. 01/14/17   Elby Beck, FNP     Allergies Lisinopril   Family History  Problem Relation Age of Onset  . Diabetes Other   . Hypertension Other     Social History Social History   Tobacco Use  . Smoking status: Never Smoker  . Smokeless tobacco: Never Used  Substance Use Topics  . Alcohol use: No  . Drug use: No    Review of Systems  Constitutional:   No fever or chills.  ENT:  we'll swelling as above without tongue swelling sore throat or difficulty swallowing or breathing Cardiovascular:   No chest pain or syncope. Respiratory:   No dyspnea or cough. Gastrointestinal:   Negative for abdominal pain, vomiting and diarrhea.  Musculoskeletal:   Negative for focal pain or swelling All other systems reviewed and are negative except as documented above in ROS and HPI.  ____________________________________________   PHYSICAL EXAM:  VITAL SIGNS: ED Triage Vitals  Enc Vitals Group     BP 06/17/17 1823 (!) 141/97     Pulse Rate 06/17/17 1823 (!) 102     Resp 06/17/17 1823 18     Temp 06/17/17 1823 98.5 F (36.9 C)     Temp Source 06/17/17 1823 Oral  SpO2 06/17/17 1823 99 %     Weight 06/17/17 1825 166 lb (75.3 kg)     Height 06/17/17 1825 5\' 2"  (1.575 m)     Head Circumference --      Peak Flow --      Pain Score 06/17/17 1824 6     Pain Loc --      Pain Edu? --      Excl. in [place]? --     Vital signs reviewed, nursing assessments reviewed.   Constitutional:   Alert and oriented. Well appearing and in no distress. Eyes:   Conjunctivae are normal. EOMI. PERRL. ENT      Head:   Normocephalic and atraumatic.      Nose:   No congestion/rhinnorhea.        Mouth/Throat:   MMM, no pharyngeal erythema. No peritonsillar mass. severely swollen upper lip, soft. No ecchymosis. Full view of oropharynx, mallampati 1.      Neck:   No meningismus. Full ROM. Hematological/Lymphatic/Immunilogical:   No cervical lymphadenopathy. Cardiovascular:   RRR. Symmetric bilateral radial and DP pulses.  No murmurs.  Respiratory:   Normal respiratory effort without tachypnea/retractions. Breath sounds are clear and equal bilaterally. No wheezes/rales/rhonchi. Gastrointestinal:   Soft and nontender. Non distended. There is no CVA tenderness.  No rebound, rigidity, or guarding.  Musculoskeletal:   Normal range of motion in all extremities. No joint effusions.  No lower extremity tenderness.  No edema. Neurologic:   Normal speech and language.  Motor grossly intact. No acute focal neurologic deficits are appreciated.  Skin:    Skin is warm, dry and intact. No rash noted.  No petechiae, purpura, or bullae.  ____________________________________________    LABS (pertinent positives/negatives) (all labs ordered are listed, but only abnormal results are displayed) Labs Reviewed  CBC  COMPREHENSIVE METABOLIC PANEL   ____________________________________________   EKG    ____________________________________________    RADIOLOGY  No results found.  ____________________________________________   PROCEDURES Procedures  ____________________________________________  DIFFERENTIAL DIAGNOSIS   angioedema  CLINICAL IMPRESSION / ASSESSMENT AND PLAN / ED COURSE  Pertinent labs & imaging results that were available during my care of the patient were reviewed by me and considered in my medical decision making (see chart for details).      Clinical Course as of Jun 18 2147  Fri Jun 17, 2017  [id number] patient presents with angioedema limited to the upper lip without any current airway compromise or threat. Possible culprit is lisinopril. Already received  Solu-Medrol and Benadryl high-dose recently. I will give her IV famotidine, IV saline bolus, observed in ED for clinical progress.presentation is not consistent with anaphylaxis, this appears to be bradykinin mediated.   [PS]  2024 Reassessed. Upper lip swelling increased, pain increased. Tongue appears slightly swollen compared to previous. Breathing easily, CTAB. No subjective throat or tongue swelling. Will plan to hospitalize for tele/airway monitoring given still worsening symptoms x 12 hours.    [PS]    Clinical Course User Index [PS] Carrie Mew, MD     ____________________________________________   FINAL CLINICAL IMPRESSION(S) / ED DIAGNOSES    Final diagnoses:  Angioedema, initial encounter     ED Discharge Orders    None      Portions of this note were generated with dragon dictation software. Dictation errors may occur despite best attempts at proofreading.    Carrie Mew, MD 06/17/17 2149

## 2017-06-17 NOTE — Telephone Encounter (Signed)
Patient called with c/o "lip swelling."  She says "it started this morning. It was fine when I got up. I got to work and took my medication, waited about 30 minutes and was going to get something to eat. I noticed my lip was tingling, then I looked and it was swelling. I started on an antibiotic and my BP was elevated, so I had some lisinopril that I stopped taking because my BP was good. I started back taking it. I never had problems in the past when I was on it, so I don't know if it's that or something else." I asked about her swallowing and breathing, she says "both are fine, no problems."  According to protocol, go to ED, patient advised to go to ED because if it is lisinopril, the ED is better equipped to handle the problems. She verbalized understanding of care advice.   Reason for Disposition . Taking an ACE Inhibitor medication  (e.g., benazepril/LOTENSIN, captopril/CAPOTEN, enalapril/VASOTEC, lisinopril/ZESTRIL)  Answer Assessment - Initial Assessment Questions 1. ONSET: "When did the swelling start?" (e.g., minutes, hours, days)     This morning 2. SEVERITY: "How swollen is it?"     Big enough to see when mouth is closed 3. ITCHING: "Is there any itching?" If so, ask: "How much?"   (Scale 1-10; mild, moderate or severe)     No, just tingling 4. PAIN: "Is the swelling painful to touch?" If so, ask: "How painful is it?"   (Scale 1-10; mild, moderate or severe)     Yes-5 5. CAUSE: "What do you think is causing the lip swelling?"     Possible medication 6. RECURRENT SYMPTOM: "Have you had lip swelling before?" If so, ask: "When was the last time?" "What happened that time?"     No 7. OTHER SYMPTOMS: "Do you have any other symptoms?" (e.g., toothache)     No 8. PREGNANCY: "Is there any chance you are pregnant?" "When was your last menstrual period?"     No  Protocols used: LIP [facility]

## 2017-06-17 NOTE — ED Notes (Addendum)
Pt able to talk without change in voice and denies swelling. No changes in lip noted. No difficulty breathing and no increased WOB noted. Pt is easily arousable.

## 2017-06-17 NOTE — ED Triage Notes (Signed)
FIRST NURSE NOTE-here for angioedema. Given 125 mg solumedrol and 50 mg benadryl at doctor office and swelling getting worse.  Significant facial swelling noted. Called for room.

## 2017-06-17 NOTE — ED Triage Notes (Signed)
Patient presents to the ED with severe swelling to upper lip.  Patient states swelling began this am and has been worsening.  Patient denies difficulty breathing.  Patient takes lisinopril.

## 2017-06-17 NOTE — ED Notes (Signed)
Pt given meal tray and drink. MD Jannifer Franklin approved

## 2017-06-18 ENCOUNTER — Other Ambulatory Visit: Payer: Self-pay

## 2017-06-18 MED ORDER — METHYLPREDNISOLONE SODIUM SUCC 125 MG IJ SOLR
60.0000 mg | Freq: Four times a day (QID) | INTRAMUSCULAR | Status: DC
  Administered 2017-06-18 – 2017-06-19 (×5): 60 mg via INTRAVENOUS
  Filled 2017-06-18 (×5): qty 2

## 2017-06-18 NOTE — Progress Notes (Signed)
[facility] at [facility]                                                                                                                                                                                  Patient Demographics   Katelyn Cobb, is a 32 y.o. female, DOB - 1986-01-24, NAT:[id number]  Admit date - 06/17/2017   Admitting Physician Lance Coon, MD  Outpatient Primary MD for the patient is Elby Beck, FNP   LOS - 0  Subjective: Patient admitted with lip swelling continues to have swollen lip    Review of Systems:   CONSTITUTIONAL: No documented fever. No fatigue, weakness. No weight gain, no weight loss.  EYES: No blurry or double vision.  ENT: No tinnitus. No postnasal drip. No redness of the oropharynx.  Positive lip swelling RESPIRATORY: No cough, no wheeze, no hemoptysis. No dyspnea.  CARDIOVASCULAR: No chest pain. No orthopnea. No palpitations. No syncope.  GASTROINTESTINAL: No nausea, no vomiting or diarrhea. No abdominal pain. No melena or hematochezia.  GENITOURINARY: No dysuria or hematuria.  ENDOCRINE: No polyuria or nocturia. No heat or cold intolerance.  HEMATOLOGY: No anemia. No bruising. No bleeding.  INTEGUMENTARY: No rashes. No lesions.  MUSCULOSKELETAL: No arthritis. No swelling. No gout.  NEUROLOGIC: No numbness, tingling, or ataxia. No seizure-type activity.  PSYCHIATRIC: No anxiety. No insomnia. No ADD.    Vitals:   Vitals:   06/17/17 2300 06/17/17 2330 06/17/17 2352 06/18/17 0431  BP: (!) 133/95 129/85 (!) 140/95 102/75  Pulse: (!) 102 92 88 89  Resp: 19 15 16 16   Temp:   97.8 F (36.6 C) 98 F (36.7 C)  TempSrc:   Oral Oral  SpO2: 100% 99% 100% 98%  Weight:   162 lb 11.2 oz (73.8 kg)   Height:   5\' 2"  (1.575 m)     Wt Readings from Last 3 Encounters:  06/17/17 162 lb 11.2 oz (73.8 kg)  01/14/17 167 lb 8 oz (76 kg)  08/04/16 172 lb 14.4 oz (78.4 kg)     Intake/Output Summary (Last 24 hours) at 06/18/2017  1316 Last data filed at 06/18/2017 1018 Gross per 24 hour  Intake 1530 ml  Output 400 ml  Net 1130 ml    Physical Exam:   GENERAL: Pleasant-appearing in no apparent distress.  HEAD, EYES, EARS, NOSE AND THROAT: Atraumatic, normocephalic. Extraocular muscles are intact. Pupils equal and reactive to light. Sclerae anicteric. No conjunctival injection. No oro-pharyngeal erythema.  Positive lip swelling NECK: Supple. There is no jugular venous distention. No bruits, no lymphadenopathy, no thyromegaly.  HEART: Regular rate and rhythm,. No murmurs, no rubs, no clicks.  LUNGS: Clear  to auscultation bilaterally. No rales or rhonchi. No wheezes.  ABDOMEN: Soft, flat, nontender, nondistended. Has good bowel sounds. No hepatosplenomegaly appreciated.  EXTREMITIES: No evidence of any cyanosis, clubbing, or peripheral edema.  +2 pedal and radial pulses bilaterally.  NEUROLOGIC: The patient is alert, awake, and oriented x3 with no focal motor or sensory deficits appreciated bilaterally.  SKIN: Moist and warm with no rashes appreciated.  Psych: Not anxious, depressed LN: No inguinal LN enlargement    Antibiotics   Anti-infectives (From admission, onward)   None      Medications   Scheduled Meds: . enoxaparin (LOVENOX) injection  40 mg Subcutaneous Q24H  . levothyroxine  75 mcg Oral QAC breakfast  . loratadine  10 mg Oral BID  . methylPREDNISolone (SOLU-MEDROL) injection  60 mg Intravenous Q6H   Continuous Infusions: PRN Meds:.acetaminophen **OR** acetaminophen, labetalol, ondansetron **OR** ondansetron (ZOFRAN) IV   Data Review:   Micro Results No results found for this or any previous visit (from the past 240 hour(s)).  Radiology Reports No results found.   CBC Recent Labs  Lab 06/17/17 2204  WBC 6.9  HGB 11.4*  HCT 36.0  PLT 341  MCV 73.2*  MCH 23.2*  MCHC 31.7*  RDW 16.7*    Chemistries  Recent Labs  Lab 06/17/17 2204  NA 139  K 3.5  CL 105  CO2 26  GLUCOSE  132*  BUN 10  CREATININE 0.72  CALCIUM 9.1  AST 26  ALT 19  ALKPHOS 54  BILITOT 0.6   ------------------------------------------------------------------------------------------------------------------ estimated creatinine clearance is 94.1 mL/min (by C-G formula based on SCr of 0.72 mg/dL). ------------------------------------------------------------------------------------------------------------------ No results for input(s): HGBA1C in the last 72 hours. ------------------------------------------------------------------------------------------------------------------ No results for input(s): CHOL, HDL, LDLCALC, TRIG, CHOLHDL, LDLDIRECT in the last 72 hours. ------------------------------------------------------------------------------------------------------------------ No results for input(s): TSH, T4TOTAL, T3FREE, THYROIDAB in the last 72 hours.  Invalid input(s): FREET3 ------------------------------------------------------------------------------------------------------------------ No results for input(s): VITAMINB12, FOLATE, FERRITIN, TIBC, IRON, RETICCTPCT in the last 72 hours.  Coagulation profile No results for input(s): INR, PROTIME in the last 168 hours.  No results for input(s): DDIMER in the last 72 hours.  Cardiac Enzymes No results for input(s): CKMB, TROPONINI, MYOGLOBIN in the last 168 hours.  Invalid input(s): CK ------------------------------------------------------------------------------------------------------------------ Invalid input(s): [place]  Patient is a 32 year old admitted with angioedema #1 angioedema -swelling still persist I have started patient on IV Solu-Medrol Continue Pepcid and Benadryl   #2  HTN (hypertension) - blood pressure medications on hold continue to monitor    #3 hypothyroidism -continue Synthroid  #4 DVT prophylaxis with Lovenox      Code Status Orders  (From admission, onward)        Start      Ordered   06/17/17 2353  Full code  Continuous     06/17/17 2353    Code Status History    Date Active Date Inactive Code Status Order ID Comments User Context   04/03/2011 1204 04/05/2011 1726 Full Code [id number]  Joesph Fillers, RN Inpatient    Advance Directive Documentation     Most Recent Value  Type of Advance Directive  Living will  Pre-existing out of facility DNR order (yellow form or pink MOST form)  -  "MOST" Form in Place?  -           Consults none  DVT Prophylaxis  Lovenox   Lab Results  Component Value Date   PLT 341 06/17/2017     Time Spent  in minutes   35 minutes greater than 50% of time spent in care coordination and counseling patient regarding the condition and plan of care.   Dustin Flock M.D on 06/18/2017 at 1:16 PM  Between 7am to 6pm - Pager - [phone]  After 6pm go to [email or web address] - Proofreader  [employer]   Office  [phone]

## 2017-06-19 MED ORDER — LABETALOL HCL 100 MG PO TABS: 100.0000 mg | ORAL_TABLET | Freq: Two times a day (BID) | ORAL | 0 refills | Status: DC

## 2017-06-19 MED ORDER — PREDNISONE 20 MG PO TABS: 40.0000 mg | ORAL_TABLET | Freq: Every day | ORAL | 0 refills | Status: AC

## 2017-06-19 MED ORDER — FAMOTIDINE 20 MG PO TABS: 20.0000 mg | ORAL_TABLET | Freq: Two times a day (BID) | ORAL | 0 refills | Status: DC

## 2017-06-19 NOTE — Progress Notes (Signed)
Discharge teaching given to patient, patient verbalized understanding and had no questions. Patient IV removed. Patient will be transported home by family. All patient belongings gathered prior to leaving.  

## 2017-06-19 NOTE — Progress Notes (Signed)
[place] at [place] was admitted to the [facility] on 06/17/2017 and Discharged  06/19/2017 and should be excused from work/school   for 4  days starting 06/17/2017 , may return to work/school without any restrictions.  Call Dustin Flock MD with questions.  Dustin Flock M.D on 06/19/2017,at 8:32 AM  [place] at [facility]  [phone]

## 2017-06-19 NOTE — Discharge Summary (Signed)
[facility] at [place], [place] y.o., DOB [birth year], MRN [id number]. Admission date: 06/17/2017 Discharge Date 06/19/2017 Primary MD Elby Beck, FNP Admitting Physician Lance Coon, MD  Admission Diagnosis  Angioedema, initial encounter [T78.3XXA]  Discharge Diagnosis   Principal Problem:   Angioedema secondary to ACE inhibitor   HTN (hypertension)   Hypothyroidism          Hospital Course  Katelyn Cobb  is a 32 y.o. female who presents with anemic and lip swelling.  Patient states that she noticed this this morning shortly after she took her dose of lisinopril.  Patient was just recently restarted back on her lisinopril.  And the symptoms started.  She was admitted and treated with IV Solu-Medrol and H2 blockers.  With these treatments her swelling improved significantly.  She is doing much better. Blood pressure medication has been changed to labetalol.              Consults  None  Significant Tests:  See full reports for all details    No results found.     Today   Subjective:   Katelyn Cobb patient lip swelling much better mostly resolved  Objective:   Blood pressure 125/82, pulse 80, temperature 98.6 F (37 C), temperature source Oral, resp. rate 16, height 5\' 2"  (1.575 m), weight 162 lb 11.2 oz (73.8 kg), SpO2 96 %.  .  Intake/Output Summary (Last 24 hours) at 06/19/2017 1422 Last data filed at 06/19/2017 0900 Gross per 24 hour  Intake 480 ml  Output 1000 ml  Net -520 ml    Exam VITAL SIGNS: Blood pressure 125/82, pulse 80, temperature 98.6 F (37 C), temperature source Oral, resp. rate 16, height 5\' 2"  (1.575 m), weight 162 lb 11.2 oz (73.8 kg), SpO2 96 %.  GENERAL:  32 y.o.-year-old patient lying in the bed with no acute distress.  EYES: Pupils equal, round, reactive to light and accommodation. No scleral icterus. Extraocular muscles intact.  HEENT: Head atraumatic, normocephalic. Oropharynx and  nasopharynx clear.  NECK:  Supple, no jugular venous distention. No thyroid enlargement, no tenderness.  LUNGS: Normal breath sounds bilaterally, no wheezing, rales,rhonchi or crepitation. No use of accessory muscles of respiration.  CARDIOVASCULAR: S1, S2 normal. No murmurs, rubs, or gallops.  ABDOMEN: Soft, nontender, nondistended. Bowel sounds present. No organomegaly or mass.  EXTREMITIES: No pedal edema, cyanosis, or clubbing.  NEUROLOGIC: Cranial nerves II through XII are intact. Muscle strength 5/5 in all extremities. Sensation intact. Gait not checked.  PSYCHIATRIC: The patient is alert and oriented x 3.  SKIN: No obvious rash, lesion, or ulcer.   Data Review     CBC w Diff:  Lab Results  Component Value Date   WBC 6.9 06/17/2017   HGB 11.4 (L) 06/17/2017   HGB 12.6 11/24/2012   HCT 36.0 06/17/2017   HCT 38.1 11/24/2012   PLT 341 06/17/2017   PLT 242 11/24/2012   LYMPHOPCT 41.3 01/14/2017   LYMPHOPCT 36.7 11/24/2012   MONOPCT 8.8 01/14/2017   MONOPCT 8.8 11/24/2012   EOSPCT 4.2 01/14/2017   EOSPCT 3.5 11/24/2012   BASOPCT 0.3 01/14/2017   BASOPCT 0.8 11/24/2012   CMP:  Lab Results  Component Value Date   NA 139 06/17/2017   NA 141 11/24/2012   K 3.5 06/17/2017   K 4.0 11/24/2012   CL 105 06/17/2017   CL 107 11/26/2011   CO2 26 06/17/2017   CO2 26 11/24/2012   BUN 10  06/17/2017   BUN 11.2 11/24/2012   CREATININE 0.72 06/17/2017   CREATININE 0.8 11/24/2012   PROT 8.0 06/17/2017   PROT 7.7 11/24/2012   ALBUMIN 4.0 06/17/2017   ALBUMIN 3.8 11/24/2012   BILITOT 0.6 06/17/2017   BILITOT 0.37 11/24/2012   ALKPHOS 54 06/17/2017   ALKPHOS 66 11/24/2012   AST 26 06/17/2017   AST 16 11/24/2012   ALT 19 06/17/2017   ALT 17 11/24/2012  .  Micro Results No results found for this or any previous visit (from the past 240 hour(s)).      Code Status Orders  (From admission, onward)        Start     Ordered   06/17/17 2353  Full code  Continuous      06/17/17 2353    Code Status History    Date Active Date Inactive Code Status Order ID Comments User Context   04/03/2011 1204 04/05/2011 1726 Full Code [id number]  Joesph Fillers, RN Inpatient    Advance Directive Documentation     Most Recent Value  Type of Advance Directive  Living will  Pre-existing out of facility DNR order (yellow form or pink MOST form)  -  "MOST" Form in Place?  -          Follow-up Information    Elby Beck, FNP Follow up in 6 day(s).   Specialties:  Nurse Practitioner, Family Medicine Contact information: [place] [address] [phone]           Discharge Medications   Allergies as of 06/19/2017      Reactions   Lisinopril Swelling      Medication List    STOP taking these medications   cephALEXin 500 MG capsule Commonly known as:  [place] these medications   famotidine 20 MG tablet Commonly known as:  PEPCID Take 1 tablet (20 mg total) by mouth 2 (two) times daily for 3 days.   labetalol 100 MG tablet Commonly known as:  NORMODYNE Take 1 tablet (100 mg total) by mouth 2 (two) times daily.   levothyroxine 75 MCG tablet Commonly known as:  SYNTHROID, LEVOTHROID Take 75 mcg by mouth daily before breakfast.   predniSONE 20 MG tablet Commonly known as:  DELTASONE Take 2 tablets (40 mg total) by mouth daily for 3 days.          Total Time in preparing paper work, data evaluation and todays exam - 11 minutes  Dustin Flock M.D on 06/19/2017 at 2:22 PM [place]  [phone]

## 2017-06-20 ENCOUNTER — Telehealth: Payer: Self-pay

## 2017-06-20 NOTE — Telephone Encounter (Signed)
Katelyn Cobb,  Can you please help me by calling this patient and getting her scheduled for a 30 min hospital f/u visit with Tor Netters by the end of this week?    I appreciate your help with this.  She discharged from the hospital yesterday for angioedema and needs to be seen within the week.  She does not qualify for TCM.    If patient has any medical questions or concerns, please route her back to me.  Thanks for your help with this.

## 2017-06-21 NOTE — Telephone Encounter (Signed)
Appointment 4/12 @ 3 pt aware

## 2017-06-24 ENCOUNTER — Encounter: Payer: Self-pay | Admitting: Family Medicine

## 2017-06-24 ENCOUNTER — Ambulatory Visit: Payer: BC Managed Care – PPO | Admitting: Family Medicine

## 2017-06-24 VITALS — BP 132/90 | HR 99 | Temp 98.5°F | Wt 164.2 lb

## 2017-06-24 DIAGNOSIS — I10 Essential (primary) hypertension: Secondary | ICD-10-CM | POA: Diagnosis not present

## 2017-06-24 DIAGNOSIS — T783XXA Angioneurotic edema, initial encounter: Secondary | ICD-10-CM

## 2017-06-24 DIAGNOSIS — T783XXD Angioneurotic edema, subsequent encounter: Secondary | ICD-10-CM | POA: Diagnosis not present

## 2017-06-24 DIAGNOSIS — Z09 Encounter for follow-up examination after completed treatment for conditions other than malignant neoplasm: Secondary | ICD-10-CM

## 2017-06-24 NOTE — Progress Notes (Signed)
Subjective:    Patient ID: Katelyn Cobb, female    DOB: [birth year], 32 y.o.   MRN: [id number]  HPI This is a 32 yo female who presents today for hospital follow up.  She was admitted at [facility] from 4/5-06/19/17 for angioedema secondary to ACE.  Last week, was seen at gyn office for right breast cyst, blood pressure was high, she had been off medication and restarted her lisinopril, which she had been on previously. She was also prescribed cephalexin (full resolution of cyst). The next day she noticed swelling and tingling of her lips. She went to urgent care and had no improvement with treatment so was sent to ER and was admitted. When she was discharged from hospital she stopped all of her medication except for her levothyroxine. She had been prescribed amlodipine at urgent care, but she never took any. Swelling was limited to upper/lower lips, she never experienced eye swelling, throat tightening, wheeze, SOB.   She realized after incident that her father and grandfather have had severe reactions to lisinopril.   Newly sexually active after several years. Using condoms.    Current Outpatient Medications:  .  levothyroxine (SYNTHROID, LEVOTHROID) 75 MCG tablet, Take 75 mcg by mouth daily before breakfast. , Disp: , Rfl:  .  triamcinolone cream (KENALOG) 0.1 %, , Disp: , Rfl: 1   Past Medical History:  Diagnosis Date  . Cancer ([place])    lymphomoa  . Hypertension   . Lymphoma in remission ([place]) 01/13/2011  . Lymphoma, Hodgkin's ([place]) 2006   in remission  . Sickle cell trait [facility])    Past Surgical History:  Procedure Laterality Date  . BREAST REDUCTION SURGERY    . LYMPH NODE BIOPSY  2005  . LYMPH NODE BIOPSY  2005   neck  . PORTACATH PLACEMENT  2006   for chemo treatment for lymphoma   Family History  Problem Relation Age of Onset  . Diabetes Other   . Hypertension Other    Social History   Tobacco Use  . Smoking status: Never Smoker  . Smokeless tobacco:  Never Used  Substance Use Topics  . Alcohol use: No  . Drug use: No      Review of Systems  Constitutional: Positive for fatigue (since illness). Negative for fever.  HENT: Negative for facial swelling.   Respiratory: Negative for chest tightness, shortness of breath and wheezing.   Cardiovascular: Negative for chest pain, palpitations and leg swelling.       Objective:   Physical Exam  Constitutional: She is oriented to person, place, and time. She appears well-developed and well-nourished. No distress.  HENT:  Head: Normocephalic and atraumatic.  Mouth/Throat: Oropharynx is clear and moist.  Eyes: Conjunctivae are normal.  Cardiovascular: Normal rate, regular rhythm and normal heart sounds.  Pulmonary/Chest: Effort normal and breath sounds normal. No respiratory distress. She has no wheezes. She has no rales. She exhibits no tenderness.  Musculoskeletal: She exhibits no edema.  Neurological: She is alert and oriented to person, place, and time.  Skin: Skin is warm and dry. She is not diaphoretic.  Psychiatric: She has a normal mood and affect. Her behavior is normal. Judgment and thought content normal.  Vitals reviewed.    BP 132/90   Pulse 99   Temp 98.5 F (36.9 C) (Oral)   Wt 164 lb 4 oz (74.5 kg)   LMP 06/14/2017   SpO2 98%   BMI 30.04 kg/m  Wt Readings from Last 3 Encounters:  06/24/17 164 lb 4 oz (74.5 kg)  06/17/17 162 lb 11.2 oz (73.8 kg)  01/14/17 167 lb 8 oz (76 kg)   BP Readings from Last 3 Encounters:  06/24/17 132/90  06/19/17 125/82  01/14/17 136/80        Assessment & Plan:  Discussed with Dr. Damita Dunnings 1. Hospital discharge follow-up - Reviewed current medications and reason for hospitalization  2. Essential hypertension - Patient has been off and on treatment with periods of normotension, discussed non pharmacologic interventions and provided information about DASH diet, will try to manage without medication. - check BP weekly - follow  up in 1 month  3. Angioedema, initial encounter - discussed likelihood of lisinopril as cause, offered referral to allergy, patient declined at this time   Clarene Reamer, FNP-BC  [facility] at [facility], [place]  06/25/2017 9:19 AM

## 2017-06-24 NOTE — Patient Instructions (Addendum)
We want you to work on lifestyle modifications for your blood pressure. Maintain a healthy diet that is low in sodium, moderate protein, and lots of vegetables.  Please follow-up in a month to see how your blood pressure is doing.  It has been a pleasure seeing you today. Denita Lung, RN, Adult-Geriatric Nurse Practitioner Student and Tor Netters, FNP  DASH Eating Plan DASH stands for "Dietary Approaches to Stop Hypertension." The DASH eating plan is a healthy eating plan that has been shown to reduce high blood pressure (hypertension). It may also reduce your risk for type 2 diabetes, heart disease, and stroke. The DASH eating plan may also help with weight loss. What are tips for following this plan? General guidelines  Avoid eating more than 2,300 mg (milligrams) of salt (sodium) a day. If you have hypertension, you may need to reduce your sodium intake to 1,500 mg a day.  Limit alcohol intake to no more than 1 drink a day for nonpregnant women and 2 drinks a day for men. One drink equals 12 oz of beer, 5 oz of wine, or 1 oz of hard liquor.  Work with your health care provider to maintain a healthy body weight or to lose weight. Ask what an ideal weight is for you.  Get at least 30 minutes of exercise that causes your heart to beat faster (aerobic exercise) most days of the week. Activities may include walking, swimming, or biking.  Work with your health care provider or diet and nutrition specialist (dietitian) to adjust your eating plan to your individual calorie needs. Reading food labels  Check food labels for the amount of sodium per serving. Choose foods with less than 5 percent of the Daily Value of sodium. Generally, foods with less than 300 mg of sodium per serving fit into this eating plan.  To find whole grains, look for the word "whole" as the first word in the ingredient list. Shopping  Buy products labeled as "low-sodium" or "no salt added."  Buy fresh foods.  Avoid canned foods and premade or frozen meals. Cooking  Avoid adding salt when cooking. Use salt-free seasonings or herbs instead of table salt or sea salt. Check with your health care provider or pharmacist before using salt substitutes.  Do not fry foods. Cook foods using healthy methods such as baking, boiling, grilling, and broiling instead.  Cook with heart-healthy oils, such as olive, canola, soybean, or sunflower oil. Meal planning   Eat a balanced diet that includes: ? 5 or more servings of fruits and vegetables each day. At each meal, try to fill half of your plate with fruits and vegetables. ? Up to 6-8 servings of whole grains each day. ? Less than 6 oz of lean meat, poultry, or fish each day. A 3-oz serving of meat is about the same size as a deck of cards. One egg equals 1 oz. ? 2 servings of low-fat dairy each day. ? A serving of nuts, seeds, or beans 5 times each week. ? Heart-healthy fats. Healthy fats called Omega-3 fatty acids are found in foods such as flaxseeds and coldwater fish, like sardines, salmon, and mackerel.  Limit how much you eat of the following: ? Canned or prepackaged foods. ? Food that is high in trans fat, such as fried foods. ? Food that is high in saturated fat, such as fatty meat. ? Sweets, desserts, sugary drinks, and other foods with added sugar. ? Full-fat dairy products.  Do not salt foods before  eating.  Try to eat at least 2 vegetarian meals each week.  Eat more home-cooked food and less restaurant, buffet, and fast food.  When eating at a restaurant, ask that your food be prepared with less salt or no salt, if possible. What foods are recommended? The items listed may not be a complete list. Talk with your dietitian about what dietary choices are best for you. Grains Whole-grain or whole-wheat bread. Whole-grain or whole-wheat pasta. Brown rice. [place]. Bulgur. Whole-grain and low-sodium cereals. Pita bread. Low-fat,  low-sodium crackers. Whole-wheat flour tortillas. Vegetables Fresh or frozen vegetables (raw, steamed, roasted, or grilled). Low-sodium or reduced-sodium tomato and vegetable juice. Low-sodium or reduced-sodium tomato sauce and tomato paste. Low-sodium or reduced-sodium canned vegetables. Fruits All fresh, dried, or frozen fruit. Canned fruit in natural juice (without added sugar). Meat and other protein foods Skinless chicken or [place]. Ground chicken or [place]. Pork with fat trimmed off. Fish and seafood. Egg whites. Dried beans, peas, or lentils. Unsalted nuts, nut butters, and seeds. Unsalted canned beans. Lean cuts of beef with fat trimmed off. Low-sodium, lean deli meat. Dairy Low-fat (1%) or fat-free (skim) milk. Fat-free, low-fat, or reduced-fat cheeses. Nonfat, low-sodium ricotta or cottage cheese. Low-fat or nonfat yogurt. Low-fat, low-sodium cheese. Fats and oils Soft margarine without trans fats. Vegetable oil. Low-fat, reduced-fat, or light mayonnaise and salad dressings (reduced-sodium). Canola, safflower, olive, soybean, and sunflower oils. Avocado. Seasoning and other foods Herbs. Spices. Seasoning mixes without salt. Unsalted popcorn and pretzels. Fat-free sweets. What foods are not recommended? The items listed may not be a complete list. Talk with your dietitian about what dietary choices are best for you. Grains Baked goods made with fat, such as croissants, muffins, or some breads. Dry pasta or rice meal packs. Vegetables Creamed or fried vegetables. Vegetables in a cheese sauce. Regular canned vegetables (not low-sodium or reduced-sodium). Regular canned tomato sauce and paste (not low-sodium or reduced-sodium). Regular tomato and vegetable juice (not low-sodium or reduced-sodium). Angie Fava. Olives. Fruits Canned fruit in a light or heavy syrup. Fried fruit. Fruit in cream or butter sauce. Meat and other protein foods Fatty cuts of meat. Ribs. Fried meat. Berniece Salines. Sausage.  Bologna and other processed lunch meats. Salami. Fatback. Hotdogs. Bratwurst. Salted nuts and seeds. Canned beans with added salt. Canned or smoked fish. Whole eggs or egg yolks. Chicken or [place] with skin. Dairy Whole or 2% milk, cream, and half-and-half. Whole or full-fat cream cheese. Whole-fat or sweetened yogurt. Full-fat cheese. Nondairy creamers. Whipped toppings. Processed cheese and cheese spreads. Fats and oils Butter. Stick margarine. Lard. Shortening. Ghee. Bacon fat. Tropical oils, such as coconut, palm kernel, or palm oil. Seasoning and other foods Salted popcorn and pretzels. Onion salt, garlic salt, seasoned salt, table salt, and sea salt. Worcestershire sauce. Tartar sauce. Barbecue sauce. Teriyaki sauce. Soy sauce, including reduced-sodium. Steak sauce. Canned and packaged gravies. Fish sauce. Oyster sauce. Cocktail sauce. Horseradish that you find on the shelf. Ketchup. Mustard. Meat flavorings and tenderizers. Bouillon cubes. Hot sauce and Tabasco sauce. Premade or packaged marinades. Premade or packaged taco seasonings. Relishes. Regular salad dressings. Where to find more information:  National Heart, Lung, and [place]: https://wilson-eaton.com/  American Heart Association: www.heart.org Summary  The DASH eating plan is a healthy eating plan that has been shown to reduce high blood pressure (hypertension). It may also reduce your risk for type 2 diabetes, heart disease, and stroke.  With the DASH eating plan, you should limit salt (sodium) intake to 2,300 mg a  day. If you have hypertension, you may need to reduce your sodium intake to 1,500 mg a day.  When on the DASH eating plan, aim to eat more fresh fruits and vegetables, whole grains, lean proteins, low-fat dairy, and heart-healthy fats.  Work with your health care provider or diet and nutrition specialist (dietitian) to adjust your eating plan to your individual calorie needs. This information is not intended to  replace advice given to you by your health care provider. Make sure you discuss any questions you have with your health care provider. Document Released: 02/18/2011 Document Revised: 02/23/2016 Document Reviewed: 02/23/2016 Elsevier Interactive Patient Education  [employer].

## 2017-06-25 ENCOUNTER — Encounter: Payer: Self-pay | Admitting: Family Medicine

## 2017-07-22 ENCOUNTER — Ambulatory Visit: Payer: BC Managed Care – PPO | Admitting: Family Medicine

## 2017-07-22 ENCOUNTER — Encounter: Payer: Self-pay | Admitting: Family Medicine

## 2017-07-22 VITALS — BP 142/96 | HR 104 | Temp 98.8°F | Ht 62.0 in | Wt 166.0 lb

## 2017-07-22 DIAGNOSIS — D649 Anemia, unspecified: Secondary | ICD-10-CM

## 2017-07-22 DIAGNOSIS — I10 Essential (primary) hypertension: Secondary | ICD-10-CM | POA: Diagnosis not present

## 2017-07-22 DIAGNOSIS — R790 Abnormal level of blood mineral: Secondary | ICD-10-CM

## 2017-07-22 DIAGNOSIS — D509 Iron deficiency anemia, unspecified: Secondary | ICD-10-CM

## 2017-07-22 NOTE — Patient Instructions (Signed)
Good to see you today  Follow up in 3 months   

## 2017-07-22 NOTE — Progress Notes (Signed)
   Subjective:    Patient ID: Katelyn Cobb, female    DOB: [birth year], 32 y.o.   MRN: [id number]  HPI  This is a 32 yo female who presents today for follow up of angioedema, HTN, hypothyroidism, anemia. She is accompanied by her school aged son and daughter. Has been doing well, watching sodium intake and trying to walk more. 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 She is hoping her mother will keep her kids in [place] this summer.   Past Medical History:  Diagnosis Date  . Cancer ([place])    lymphomoa  . Hypertension   . Lymphoma in remission ([place]) 01/13/2011  . Lymphoma, Hodgkin's ([place]) 2006   in remission  . Sickle cell trait [facility])    Past Surgical History:  Procedure Laterality Date  . BREAST REDUCTION SURGERY    . LYMPH NODE BIOPSY  2005  . LYMPH NODE BIOPSY  2005   neck  . PORTACATH PLACEMENT  2006   for chemo treatment for lymphoma   Family History  Problem Relation Age of Onset  . Diabetes Other   . Hypertension Other    Social History   Tobacco Use  . Smoking status: Never Smoker  . Smokeless tobacco: Never Used  Substance Use Topics  . Alcohol use: No  . Drug use: No       Review of Systems Per HPI    Objective:   Physical Exam Physical Exam  Constitutional: Oriented to person, place, and time. She appears well-developed and well-nourished.  HENT:  Head: Normocephalic and atraumatic.  Eyes: Conjunctivae are normal.  Neck: Normal range of motion. Neck supple.  Cardiovascular: Normal rate, regular rhythm and normal heart sounds.   Pulmonary/Chest: Effort normal and breath sounds normal.  Musculoskeletal: Normal range of motion.  Neurological: Alert and oriented to person, place, and time.  Skin: Skin is warm and dry.  Psychiatric: Normal mood and affect. Behavior is normal. Judgment and thought content normal.  Vitals reviewed.     BP (!) 142/96 (BP Location: Right Arm, Patient Position: Sitting, Cuff Size: Normal)   Pulse (!) 104    Temp 98.8 F (37.1 C) (Oral)   Ht 5\' 2"  (1.575 m)   Wt 166 lb (75.3 kg)   LMP 07/09/2017   SpO2 97%   BMI 30.36 kg/m  Wt Readings from Last 3 Encounters:  07/22/17 166 lb (75.3 kg)  06/24/17 164 lb 4 oz (74.5 kg)  06/17/17 162 lb 11.2 oz (73.8 kg)   BP Readings from Last 3 Encounters:  07/22/17 (!) 142/96  06/24/17 132/90  06/19/17 125/82        Assessment & Plan:  1. Essential hypertension - will continue to monitor BP, she is understandably concerned about medication following angioedema - continue to work on [place] and exercise - TSH - follow up in 3 months  2. Anemia, unspecified type - Ferritin - CBC with Differential   Clarene Reamer, FNP-BC  [facility], [facility]  07/22/2017 4:01 PM

## 2017-07-22 NOTE — Addendum Note (Signed)
Addended by: Lendon Collar on: 07/22/2017 04:28 PM   Modules accepted: Orders

## 2017-07-23 LAB — CBC WITH DIFFERENTIAL/PLATELET
BASOS ABS: 20 {cells}/uL (ref 0–200)
Basophils Relative: 0.4 %
EOS ABS: 142 {cells}/uL (ref 15–500)
EOS PCT: 2.9 %
HCT: 31 % — ABNORMAL LOW (ref 35.0–45.0)
HEMOGLOBIN: 9.7 g/dL — AB (ref 11.7–15.5)
Lymphs Abs: 1980 cells/uL (ref 850–3900)
MCH: 22.9 pg — AB (ref 27.0–33.0)
MCHC: 31.3 g/dL — ABNORMAL LOW (ref 32.0–36.0)
MCV: 73.1 fL — ABNORMAL LOW (ref 80.0–100.0)
MONOS PCT: 10.2 %
MPV: 10.4 fL (ref 7.5–12.5)
NEUTROS ABS: 2259 {cells}/uL (ref 1500–7800)
Neutrophils Relative %: 46.1 %
Platelets: 281 10*3/uL (ref 140–400)
RBC: 4.24 10*6/uL (ref 3.80–5.10)
RDW: 16.3 % — ABNORMAL HIGH (ref 11.0–15.0)
TOTAL LYMPHOCYTE: 40.4 %
WBC mixed population: 500 cells/uL (ref 200–950)
WBC: 4.9 10*3/uL (ref 3.8–10.8)

## 2017-07-23 LAB — TSH: TSH: 2.23 m[IU]/L

## 2017-07-23 LAB — FERRITIN: FERRITIN: 4 ng/mL — AB (ref 10–154)

## 2017-07-25 NOTE — Addendum Note (Signed)
Addended by: Clarene Reamer B on: 07/25/2017 04:59 PM   Modules accepted: Orders

## 2017-07-27 ENCOUNTER — Telehealth: Payer: Self-pay

## 2017-07-27 ENCOUNTER — Encounter (INDEPENDENT_AMBULATORY_CARE_PROVIDER_SITE_OTHER): Payer: Self-pay

## 2017-07-27 NOTE — Telephone Encounter (Signed)
Please Advise

## 2017-07-27 NOTE — Telephone Encounter (Signed)
Copied from [place] [id number]. Topic: Referral - Status >> Jul 27, 2017  1:59 PM Bea Graff, NT wrote: Reason for CRM: Melissa with the [facility] calling and states she has been unable to reach the pt to cancel and she is unable to leave a voicemail. CB#: [phone]

## 2017-07-27 NOTE — Telephone Encounter (Signed)
I sent the patient a Mychart message to call Melissa at the [facility] along with her number. I also sent Melissa a message that we were trying to contact her directly to get her to return their call.

## 2017-07-28 NOTE — Telephone Encounter (Signed)
Thank you :)

## 2018-02-16 LAB — HIV ANTIBODY (ROUTINE TESTING W REFLEX): HIV SCREEN 4TH GENERATION: NONREACTIVE

## 2018-05-12 ENCOUNTER — Telehealth: Payer: Self-pay | Admitting: *Deleted

## 2018-05-12 NOTE — Telephone Encounter (Signed)
Patient needs follow up labs

## 2018-05-12 NOTE — Telephone Encounter (Signed)
Received fax from [facility] requesting refill for Levothyroxine 75 mcg.   Levothyroxine is not listed on current medication list.  Please advise.

## 2018-05-26 ENCOUNTER — Encounter: Payer: Self-pay | Admitting: Family Medicine

## 2018-05-29 ENCOUNTER — Other Ambulatory Visit: Payer: Self-pay

## 2018-05-29 ENCOUNTER — Other Ambulatory Visit (INDEPENDENT_AMBULATORY_CARE_PROVIDER_SITE_OTHER): Payer: BC Managed Care – PPO

## 2018-05-29 ENCOUNTER — Other Ambulatory Visit: Payer: Self-pay | Admitting: Family Medicine

## 2018-05-29 DIAGNOSIS — C8590 Non-Hodgkin lymphoma, unspecified, unspecified site: Secondary | ICD-10-CM

## 2018-05-29 DIAGNOSIS — I10 Essential (primary) hypertension: Secondary | ICD-10-CM

## 2018-05-29 DIAGNOSIS — D509 Iron deficiency anemia, unspecified: Secondary | ICD-10-CM

## 2018-05-29 DIAGNOSIS — E039 Hypothyroidism, unspecified: Secondary | ICD-10-CM | POA: Diagnosis not present

## 2018-05-29 DIAGNOSIS — R7309 Other abnormal glucose: Secondary | ICD-10-CM

## 2018-05-29 LAB — CBC WITH DIFFERENTIAL/PLATELET
BASOS ABS: 0 10*3/uL (ref 0.0–0.1)
Basophils Relative: 0.5 % (ref 0.0–3.0)
EOS PCT: 6.2 % — AB (ref 0.0–5.0)
Eosinophils Absolute: 0.3 10*3/uL (ref 0.0–0.7)
HCT: 32.5 % — ABNORMAL LOW (ref 36.0–46.0)
HEMOGLOBIN: 10.3 g/dL — AB (ref 12.0–15.0)
Lymphocytes Relative: 38.6 % (ref 12.0–46.0)
Lymphs Abs: 1.7 10*3/uL (ref 0.7–4.0)
MCHC: 31.8 g/dL (ref 30.0–36.0)
MCV: 71.1 fl — ABNORMAL LOW (ref 78.0–100.0)
MONOS PCT: 9.9 % (ref 3.0–12.0)
Monocytes Absolute: 0.4 10*3/uL (ref 0.1–1.0)
NEUTROS PCT: 44.8 % (ref 43.0–77.0)
Neutro Abs: 2 10*3/uL (ref 1.4–7.7)
Platelets: 285 10*3/uL (ref 150.0–400.0)
RBC: 4.57 Mil/uL (ref 3.87–5.11)
RDW: 18.1 % — ABNORMAL HIGH (ref 11.5–15.5)
WBC: 4.4 10*3/uL (ref 4.0–10.5)

## 2018-05-29 LAB — COMPREHENSIVE METABOLIC PANEL
ALT: 24 U/L (ref 0–35)
AST: 21 U/L (ref 0–37)
Albumin: 4 g/dL (ref 3.5–5.2)
Alkaline Phosphatase: 52 U/L (ref 39–117)
BUN: 11 mg/dL (ref 6–23)
CALCIUM: 9 mg/dL (ref 8.4–10.5)
CHLORIDE: 105 meq/L (ref 96–112)
CO2: 27 meq/L (ref 19–32)
Creatinine, Ser: 0.73 mg/dL (ref 0.40–1.20)
GFR: 109.87 mL/min (ref >60.00)
Glucose, Bld: 118 mg/dL — ABNORMAL HIGH (ref 70–99)
POTASSIUM: 3.8 meq/L (ref 3.5–5.1)
Sodium: 137 mEq/L (ref 135–145)
Total Bilirubin: 0.3 mg/dL (ref 0.2–1.2)
Total Protein: 7.4 g/dL (ref 6.0–8.3)

## 2018-05-29 LAB — HEMOGLOBIN A1C: HEMOGLOBIN A1C: 5.5 % (ref 4.6–6.5)

## 2018-05-29 LAB — TSH: TSH: 4.56 u[IU]/mL — AB (ref 0.35–4.50)

## 2018-05-29 LAB — FERRITIN: FERRITIN: 3.8 ng/mL — AB (ref 10.0–291.0)

## 2018-05-29 NOTE — Progress Notes (Signed)
Patient presented for labs so she can get refill of medications. No follow up on file, she was instructed that she needs to make an appointment for 1-2 weeks to follow up chronic medical conditions.

## 2018-06-12 ENCOUNTER — Encounter: Payer: Self-pay | Admitting: Family Medicine

## 2018-06-12 ENCOUNTER — Ambulatory Visit (INDEPENDENT_AMBULATORY_CARE_PROVIDER_SITE_OTHER): Payer: BC Managed Care – PPO | Admitting: Family Medicine

## 2018-06-12 ENCOUNTER — Other Ambulatory Visit: Payer: Self-pay

## 2018-06-12 VITALS — Ht 62.0 in | Wt 161.0 lb

## 2018-06-12 DIAGNOSIS — D509 Iron deficiency anemia, unspecified: Secondary | ICD-10-CM

## 2018-06-12 DIAGNOSIS — E039 Hypothyroidism, unspecified: Secondary | ICD-10-CM

## 2018-06-12 MED ORDER — FERROUS GLUCONATE 225 (27 FE) MG PO TABS: 240.0000 mg | ORAL_TABLET | Freq: Two times a day (BID) | ORAL | 3 refills | Status: DC

## 2018-06-12 MED ORDER — LEVOTHYROXINE SODIUM 75 MCG PO TABS: 75.0000 ug | ORAL_TABLET | Freq: Every day | ORAL | 3 refills | Status: DC

## 2018-06-12 NOTE — Progress Notes (Signed)
Virtual Visit via Video Note  I connected with Katelyn Cobb on 06/12/18 at 11:45 AM EDT by a video enabled telemedicine application and verified that I am speaking with the correct person using two identifiers. She was at her place of employeement and I was in my office.    I discussed the limitations of evaluation and management by telemedicine and the availability of in person appointments. The patient expressed understanding and agreed to proceed.  History of Present Illness: This is a 33 yo female who is following up on chronic medical conditions.  Her father passed away 2 days ago. He had a stroke 3 weeks ago and was in a SNF. According to the patient, he had been going downhill since being discharged from the hospital.   Anemia- has not been taking iron. Labs from 2 weeks ago showed continued low ferritin at 3.8. She denies chest pain, palpitations, SOB. Chronically fatigued. I saw her last year and referred her to hematology. She never made appointment. She has seen oncology in the past (last 11/24/2012) for Hodgkin's lymphoma in remission and was released to prn follow up. She thinks she has received iron infusions in the past. She does not want to go to hematology now, preferring to try oral iron consistently for 3 months and rechecking labs.   Hypothyroidism- has been out of levothyroxine for several weeks. TSH 4.56 after two weeks off medication.     Observations/Objective: The patient was visible via video. She was alert and answered questions appropriately. She conversed in complete sentences. Mood and affect appropriate.   Assessment and Plan: 1. Iron deficiency anemia, unspecified iron deficiency anemia type - discussed importance of replacing her iron stores and she wants to try oral for 3 months, will return for ov and labs  - reviewed iron rich foods and encouraged her to eat these - ferrous gluconate (FERGON) 225 (27 Fe) MG tablet; Take 1 tablet (240 mg total) by mouth 2  (two) times daily with a meal.  Dispense: 180 tablet; Refill: 3 -MyChart message was sent to the patient outlining the points we discussed.  2. Hypothyroidism, unspecified type - levothyroxine (SYNTHROID, LEVOTHROID) 75 MCG tablet; Take 1 tablet (75 mcg total) by mouth daily before breakfast.  Dispense: 90 tablet; Refill: [place], FNP-BC  [facility]  06/12/2018 1:31 PM   Follow Up Instructions:    I discussed the assessment and treatment plan with the patient. The patient was provided an opportunity to ask questions and all were answered. The patient agreed with the plan and demonstrated an understanding of the instructions.   The patient was advised to call back or seek an in-person evaluation if the symptoms worsen or if the condition fails to improve as anticipated.    Elby Beck, FNP

## 2018-06-14 NOTE — Progress Notes (Signed)
I spoke with patient but lost connection- not sure what happened. I called back and left msg for pt to call back to schedule 3 mo follow up with labs prior

## 2018-07-05 ENCOUNTER — Telehealth: Payer: Self-pay | Admitting: Family Medicine

## 2018-07-05 NOTE — Telephone Encounter (Signed)
Best number [phone] ext [phone]  Levada Dy in Medial records for  [facility] Needs signed medical records release pt has never mention seeing anyone here

## 2018-07-05 NOTE — Telephone Encounter (Signed)
Spoke with Levada Dy and advised that we will have to wait till patient comes in the office to get signed ROI and then can update records.

## 2018-09-10 ENCOUNTER — Other Ambulatory Visit: Payer: Self-pay | Admitting: Family Medicine

## 2018-09-10 DIAGNOSIS — D509 Iron deficiency anemia, unspecified: Secondary | ICD-10-CM

## 2018-09-10 DIAGNOSIS — E039 Hypothyroidism, unspecified: Secondary | ICD-10-CM

## 2018-09-10 NOTE — Progress Notes (Signed)
Lab orders entered for follow up.

## 2018-09-11 ENCOUNTER — Other Ambulatory Visit: Payer: Self-pay

## 2018-09-11 ENCOUNTER — Other Ambulatory Visit (INDEPENDENT_AMBULATORY_CARE_PROVIDER_SITE_OTHER): Payer: BC Managed Care – PPO

## 2018-09-11 DIAGNOSIS — D509 Iron deficiency anemia, unspecified: Secondary | ICD-10-CM

## 2018-09-11 DIAGNOSIS — E039 Hypothyroidism, unspecified: Secondary | ICD-10-CM | POA: Diagnosis not present

## 2018-09-11 LAB — CBC WITH DIFFERENTIAL/PLATELET
Basophils Absolute: 0 10*3/uL (ref 0.0–0.1)
Basophils Relative: 0.4 % (ref 0.0–3.0)
Eosinophils Absolute: 0.2 10*3/uL (ref 0.0–0.7)
Eosinophils Relative: 5 % (ref 0.0–5.0)
HCT: 34.5 % — ABNORMAL LOW (ref 36.0–46.0)
Hemoglobin: 11 g/dL — ABNORMAL LOW (ref 12.0–15.0)
Lymphocytes Relative: 49.6 % — ABNORMAL HIGH (ref 12.0–46.0)
Lymphs Abs: 2.3 10*3/uL (ref 0.7–4.0)
MCHC: 32 g/dL (ref 30.0–36.0)
MCV: 73.2 fl — ABNORMAL LOW (ref 78.0–100.0)
Monocytes Absolute: 0.6 10*3/uL (ref 0.1–1.0)
Monocytes Relative: 12.4 % — ABNORMAL HIGH (ref 3.0–12.0)
Neutro Abs: 1.5 10*3/uL (ref 1.4–7.7)
Neutrophils Relative %: 32.6 % — ABNORMAL LOW (ref 43.0–77.0)
Platelets: 260 10*3/uL (ref 150.0–400.0)
RBC: 4.71 Mil/uL (ref 3.87–5.11)
RDW: 17.5 % — ABNORMAL HIGH (ref 11.5–15.5)
WBC: 4.7 10*3/uL (ref 4.0–10.5)

## 2018-09-11 LAB — FERRITIN: Ferritin: 4.7 ng/mL — ABNORMAL LOW (ref 10.0–291.0)

## 2018-09-11 LAB — TSH: TSH: 9.08 u[IU]/mL — ABNORMAL HIGH (ref 0.35–4.50)

## 2018-09-13 ENCOUNTER — Encounter: Payer: Self-pay | Admitting: Family Medicine

## 2018-09-13 ENCOUNTER — Ambulatory Visit (INDEPENDENT_AMBULATORY_CARE_PROVIDER_SITE_OTHER): Payer: BC Managed Care – PPO | Admitting: Family Medicine

## 2018-09-13 ENCOUNTER — Other Ambulatory Visit: Payer: Self-pay

## 2018-09-13 VITALS — Temp 98.8°F | Wt 166.0 lb

## 2018-09-13 DIAGNOSIS — I10 Essential (primary) hypertension: Secondary | ICD-10-CM

## 2018-09-13 DIAGNOSIS — E039 Hypothyroidism, unspecified: Secondary | ICD-10-CM

## 2018-09-13 DIAGNOSIS — R790 Abnormal level of blood mineral: Secondary | ICD-10-CM

## 2018-09-13 DIAGNOSIS — D509 Iron deficiency anemia, unspecified: Secondary | ICD-10-CM | POA: Diagnosis not present

## 2018-09-13 MED ORDER — LEVOTHYROXINE SODIUM 88 MCG PO TABS: 88.0000 ug | ORAL_TABLET | Freq: Every day | ORAL | 3 refills | Status: DC

## 2018-09-13 NOTE — Progress Notes (Signed)
Virtual Visit via Video Note  I connected with Katelyn Cobb on 09/13/18 at  8:08 AM EDT by a video enabled telemedicine application and verified that I am speaking with the correct person using two identifiers.  Location: Patient: In her home Provider: Linda   I discussed the limitations of evaluation and management by telemedicine and the availability of in person appointments. The patient expressed understanding and agreed to proceed.  History of Present Illness: This is a 33 yo female who presents today for virtual visit for follow up of chronic medical conditions. She had labs drawn prior to today's visit. 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 Stressful and school aged kids home.   Iron deficiency anemia- Ferriitn 4.7 09/11/18, up slightly from 3.8. Referral previously placed to hematology. She reports that she has been taking iron every other day. She is agreeable to hematology referral. She denies chest pain, fatigue or SOB.   Hypertension- has been well controlled off medication. She is not able to check at home.   Hypothyroidism-  TSH 9. She was taking iron with her levothyroxine for about a month. Not sure of effect since she was only taking iron every other day.   Past Medical History:  Diagnosis Date  . Cancer ([place])    lymphomoa  . Hypertension   . Lymphoma in remission ([place]) 01/13/2011  . Lymphoma, Hodgkin's ([place]) 2006   in remission  . Sickle cell trait [facility])    Past Surgical History:  Procedure Laterality Date  . BREAST REDUCTION SURGERY    . LYMPH NODE BIOPSY  2005  . LYMPH NODE BIOPSY  2005   neck  . PORTACATH PLACEMENT  2006   for chemo treatment for lymphoma   Family History  Problem Relation Age of Onset  . Diabetes Other   . Hypertension Other    Social History   Tobacco Use  . Smoking status: Never Smoker  . Smokeless tobacco: Never Used  Substance Use Topics   . Alcohol use: No  . Drug use: No      Observations/Objective: Patient is alert and answers questions appropriately.  Visible skin is unremarkable.  She is normally conversive without shortness of breath, audible wheeze or cough.  Her mood and affect are appropriate.  Temp 98.8 F (37.1 C) (Oral)   Wt 166 lb (75.3 kg)   LMP 08/25/2018   BMI 30.36 kg/m  Wt Readings from Last 3 Encounters:  09/13/18 166 lb (75.3 kg)  06/12/18 161 lb (73 kg)  07/22/17 166 lb (75.3 kg)   BP Readings from Last 3 Encounters:  07/22/17 (!) 142/96  06/24/17 132/90  06/19/17 125/82     Assessment and Plan: 1. Hypothyroidism, unspecified type - will increase levothyroxine dose from 75 to 88, recheck TSH 8-12 weeks - levothyroxine (SYNTHROID) 88 MCG tablet; Take 1 tablet (88 mcg total) by mouth daily before breakfast.  Dispense: 90 tablet; Refill: 3  2. Iron deficiency anemia, unspecified iron deficiency anemia type - discussed importance of correction and likely needs iron infusions, she agrees to hematology referral - Ambulatory referral to Hematology  3. Low serum ferritin level -Discussed long-term consequences of untreated anemia and low ferritin - Ambulatory referral to Hematology  4. Essential hypertension -Previously well controlled without medication, follow-up in 3-4 months in the office- will be due for CPE   Clarene Reamer, FNP-BC  [facility]  09/13/2018 8:26 AM   Follow Up Instructions: Visit recap sent to patient via my chart   I discussed the assessment and treatment plan with the patient. The patient was provided an opportunity to ask questions and all were answered. The patient agreed with the plan and demonstrated an understanding of the instructions.   The patient was advised to call back or seek an in-person evaluation if the symptoms worsen or if the condition fails to improve as anticipated.     Elby Beck, FNP

## 2018-09-14 ENCOUNTER — Other Ambulatory Visit: Payer: Self-pay

## 2018-09-18 ENCOUNTER — Other Ambulatory Visit: Payer: Self-pay

## 2018-09-18 ENCOUNTER — Encounter: Payer: Self-pay | Admitting: Internal Medicine

## 2018-09-18 ENCOUNTER — Inpatient Hospital Stay: Payer: BC Managed Care – PPO | Attending: Internal Medicine | Admitting: Internal Medicine

## 2018-09-18 DIAGNOSIS — D509 Iron deficiency anemia, unspecified: Secondary | ICD-10-CM

## 2018-09-18 DIAGNOSIS — D500 Iron deficiency anemia secondary to blood loss (chronic): Secondary | ICD-10-CM

## 2018-09-18 NOTE — Assessment & Plan Note (Addendum)
#  Chronic microcytic anemia of unclear etiology -question iron deficiency versus other causes.  Hemoglobin 11 MCV 73; ferritin 4.6; iron saturations normal.  Patient is not significantly symptomatic-recommend take iron pill/space out from Synthroid.  If not improved then would recommend further work-up for iron deficiency.  #History of Hodgkin's disease stage II-status post chemotherapy followed by radiation [2005].  Clinically no evidence of recurrence.  Will discuss survivorship plan.  #Hypothyroidism-TSH most recently 9.5.  Recommend spacing out Synthroid from her iron pill.  Thank you, Ms.Gessner, FNP for allowing me to participate in the care of your pleasant patient. Please do not hesitate to contact me with questions or concerns in the interim.  # # DISPOSITION:  # follow up in 3 months- MD/labs-cbc/cmp/ldh;iron studies/ferritin retic count- Dr.B

## 2018-09-18 NOTE — Progress Notes (Signed)
I connected with Katelyn Cobb on 09/18/18 at 11:15 AM EDT by video enabled telemedicine visit and verified that I am speaking with the correct person using two identifiers.  I discussed the limitations, risks, security and privacy concerns of performing an evaluation and management service by telemedicine and the availability of in-person appointments. I also discussed with the patient that there may be a patient responsible charge related to this service. The patient expressed understanding and agreed to proceed.    Other persons participating in the visit and their role in the encounter: RN  Patient's location: Home Provider's location: Office  Oncology History Overview Note  # # 2007-CHRONIC MICROCYTIC ANEMIA [LOW ferritin/N-Iron sats]/ ? Sickle cell trait [Dr.kasoon Khan]; recommend PO iron  # 2005- [place]- Stage II [at age of 11; [place]] ABVD x 4 months;s/p RT [Dr.Kasooln Khan/Dr.Wu]  # Hypothyroidim- [sec to HD]/ ? Sickle cell trait-    Lymphoma in remission ([place])  01/13/2011 Initial Diagnosis   Lymphoma in remission [facility])      Chief Complaint: Microcytic anemia   History of present illness:Katelyn Cobb 33 y.o.  female with history of Hodgkin's disease stage II chemoradiation more than 15 years ago-currently referred to [place] for further evaluation recommendations for microcytic anemia.  Patient states that she has been anemic for many years.  She tells me that she has a diagnosis of sickle cell trait.  Has not received any blood transfusions.  Denies any blood in urine.  Denies any blood in bowels.  No change in bowel habits.  Mild fatigue not significantly worse.  She denies having any EGD or colonoscopies.  Denies having any stomach surgeries.  Denies any abdominal weight loss.   She has regular menstrual cycles which for the first 2 days are quite heavy; and rest of the menstrual cycles are normal.   Observation/objective: Hemoglobin 11 MCV 73; ferritin  4.6; normal kidney function.  Assessment and plan: Iron deficiency anemia due to chronic blood loss     Microcytic anemia  #Chronic microcytic anemia of unclear etiology -question iron deficiency versus other causes.  Hemoglobin 11 MCV 73; ferritin 4.6; iron saturations normal.  Patient is not significantly symptomatic-recommend take iron pill/space out from Synthroid.  If not improved then would recommend further work-up for iron deficiency.  #History of Hodgkin's disease stage II-status post chemotherapy followed by radiation [2005].  Clinically no evidence of recurrence.  Will discuss survivorship plan.  #Hypothyroidism-TSH most recently 9.5.  Recommend spacing out Synthroid from her iron pill.  Thank you, Ms.Gessner, FNP for allowing me to participate in the care of your pleasant patient. Please do not hesitate to contact me with questions or concerns in the interim.  # # DISPOSITION:  # follow up in 3 months- MD/labs-cbc/cmp/ldh;iron studies/ferritin retic count- Dr.B   Follow-up instructions:  I discussed the assessment and treatment plan with the patient.  The patient was provided an opportunity to ask questions and all were answered.  The patient agreed with the plan and demonstrated understanding of instructions.  The patient was advised to call back or seek an in person evaluation if the symptoms worsen or if the condition fails to improve as anticipated.     Dr. Charlaine Dalton Coll[place]an at [facility] 09/18/2018 1:39 PM

## 2018-12-15 ENCOUNTER — Encounter: Payer: Self-pay | Admitting: Internal Medicine

## 2018-12-17 NOTE — Progress Notes (Signed)
[place] NOTE  Patient Care Team: Elby Beck, FNP as PCP - General (Nurse Practitioner)  CHIEF COMPLAINTS/PURPOSE OF CONSULTATION:    HEMATOLOGY HISTORY  Oncology History Overview Note  # # 2007-CHRONIC MICROCYTIC ANEMIA [LOW ferritin/N-Iron sats]/ ? 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 Thalassemia trait- ; poorly controlled HTN   Lymphoma in remission ([place])  01/13/2011 Initial Diagnosis   Lymphoma in remission ([place])      # ANEMIA EGD-; colonoscopy- ; capsule-? Bone marrow Biopsy-?  HISTORY OF PRESENTING ILLNESS:  Katelyn Cobb 33 y.o.  female with a history of microcytic anemia-sickle cell trait/iron deficiency is here for follow-up.  At the last visit patient was started on oral iron pills.  Patient is on iron pills tolerating fairly well.  Patient complains of fatigue.  Denies any headaches but denies any chest pain.  Denies any blood in stools or black or stools.  No blood in urine.  No difficulty swallowing.  No weight loss.  She denies any heavy menstrual cycles.  Patient states that she was taken off her blood pressure medication approximately 1 year ago when she had angioedema from ACE inhibitor.  She does not check blood pressure at home.  Review of Systems  Constitutional: Positive for malaise/fatigue. Negative for chills, diaphoresis, fever and weight loss.  HENT: Negative for nosebleeds and sore throat.   Eyes: Negative for double vision.  Respiratory: Negative for cough, hemoptysis, sputum production, shortness of breath and wheezing.   Cardiovascular: Negative for chest pain, palpitations, orthopnea and leg swelling.  Gastrointestinal: Negative for abdominal pain, blood in stool, constipation, diarrhea, heartburn, melena, nausea and vomiting.   Genitourinary: Negative for dysuria, frequency and urgency.  Musculoskeletal: Negative for back pain and joint pain.  Skin: Negative.  Negative for itching and rash.  Neurological: Negative for dizziness, tingling, focal weakness, weakness and headaches.  Endo/Heme/Allergies: Does not bruise/bleed easily.  Psychiatric/Behavioral: Negative for depression. The patient is not nervous/anxious and does not have insomnia.     MEDICAL HISTORY:  Past Medical History:  Diagnosis Date  . Angioedema   . Cancer ([place])    lymphomoa  . Hypertension   . Hypothyroidism   . Lymphoma in remission ([place]) 01/13/2011  . Lymphoma, Hodgkin's ([place]) 2006   in remission  . Sickle cell trait ([place])     SURGICAL HISTORY: Past Surgical History:  Procedure Laterality Date  . BREAST REDUCTION SURGERY    . LYMPH NODE BIOPSY  2005  . LYMPH NODE BIOPSY  2005   neck  . PORTACATH PLACEMENT  2006   for chemo treatment for lymphoma    SOCIAL HISTORY: Social History   Socioeconomic History  . Marital status: Single    Spouse name: Not on file  . Number of children: Not on file  . Years of education: Not on file  . Highest education level: Not on file  Occupational History  . Not on file  Social Needs  . Financial resource strain: Not on file  . Food insecurity    Worry: Not on file    Inability: Not on file  . Transportation needs    Medical: Not on file    Non-medical: Not on file  Tobacco Use  . Smoking status: Never Smoker  . Smokeless tobacco: Never Used  Substance and Sexual Activity  .  Alcohol use: Yes  . Drug use: No  . Sexual activity: Yes  Lifestyle  . Physical activity    Days per week: Not on file    Minutes per session: Not on file  . Stress: Not on file  Relationships  . Social Herbalist on phone: Not on file    Gets together: Not on file    Attends religious service: Not on file    Active member of club or organization: Not on file    Attends meetings of clubs  or organizations: Not on file    Relationship status: Not on file  . Intimate partner violence    Fear of current or ex partner: Not on file    Emotionally abused: Not on file    Physically abused: Not on file    Forced sexual activity: Not on file  Other Topics Concern  . Not on file  Social History Narrative   She is single, has a son (2009), daughter (2013)   She is a Art gallery manager   She enjoys movies, reading and going to the park with her kids.     FAMILY HISTORY: Family History  Problem Relation Age of Onset  . Diabetes Other   . Hypertension Other     ALLERGIES:  is allergic to angiotensin receptor blockers and lisinopril.  MEDICATIONS:  Current Outpatient Medications  Medication Sig Dispense Refill  . ferrous gluconate (FERGON) 225 (27 Fe) MG tablet Take 1 tablet (240 mg total) by mouth 2 (two) times daily with a meal. 180 tablet 3  . levothyroxine (SYNTHROID) 88 MCG tablet Take 1 tablet (88 mcg total) by mouth daily before breakfast. 90 tablet 3  . triamcinolone cream (KENALOG) 0.1 % Apply 1 application topically as needed.   1  . amLODipine (NORVASC) 5 MG tablet Take 1 tablet (5 mg total) by mouth daily. 30 tablet 0   No current facility-administered medications for this visit.       PHYSICAL EXAMINATION:   Vitals:   12/18/18 1031 12/18/18 1101  BP: (!) 163/115 (!) 171/110  Pulse: 81 92  Temp: 98.2 F (36.8 C)    Filed Weights   12/18/18 1031  Weight: 171 lb (77.6 kg)    Physical Exam  Constitutional: She is oriented to person, place, and time and well-developed, well-nourished, and in no distress.  HENT:  Head: Normocephalic and atraumatic.  Mouth/Throat: Oropharynx is clear and moist. No oropharyngeal exudate.  Eyes: Pupils are equal, round, and reactive to light.  Neck: Normal range of motion. Neck supple.  Cardiovascular: Normal rate and regular rhythm.  Pulmonary/Chest: No respiratory distress. She has no wheezes.  Abdominal: Soft. Bowel  sounds are normal. She exhibits no distension and no mass. There is no abdominal tenderness. There is no rebound and no guarding.  Musculoskeletal: Normal range of motion.        General: No tenderness or edema.  Neurological: She is alert and oriented to person, place, and time.  Skin: Skin is warm.  Psychiatric: Affect normal.    LABORATORY DATA:  I have reviewed the data as listed Lab Results  Component Value Date   WBC 5.0 12/18/2018   HGB 12.3 12/18/2018   HCT 38.7 12/18/2018   MCV 78.7 (L) 12/18/2018   PLT 306 12/18/2018   Recent Labs    05/29/18 1216 12/18/18 1009  NA 137 140  K 3.8 3.8  CL 105 102  CO2 27 28  GLUCOSE 118* 98  BUN 11 7  CREATININE 0.73 0.60  CALCIUM 9.0 9.2  GFRNONAA  --  >60  GFRAA  --  >60  PROT 7.4 8.2*  ALBUMIN 4.0 4.3  AST 21 18  ALT 24 17  ALKPHOS 52 51  BILITOT 0.3 0.3     No results found.  Iron deficiency  #Microcytic anemia-question iron deficiency/question thalassemia trait.  On p.o. iron.  Hemoglobin improved at 12.2.  Iron studies pending.  Continue p.o. iron for now.  #Etiology of iron deficient anemia is unclear.-No significant menstrual cycles.  Recommend stool occult blood/UA.  Declines GI evaluation [previously [facility]]  #History of Hodgkin's disease stage II-status post chemotherapy followed by radiation [2005].  Clinically no evidence of recurrence.  Refer to survivorship clinic.   # HTN- previously on liisinopril; stopped sec to angioedema; -poorly controlled-160/115  [rechecked].  Discussed that poorly controlled blood pressures can cause a stroke/heart attacks and other cardiovascular problems. START Norvasc 34m/day; Checking blood pressures at home frequently.keep a log of blood pressures/and bring it to PCPs attention.  Made appointment for the patient to follow-up with PCP this week.  #Hypothyroidism-TSH most recently 9.5.  On Synthroid follow-up PCP  # DISPOSITION:  # referral to survivorship clinic.  DOR:[id number] # UA today/ stool cardsx3 # follow up in 4 months- MD/labs-cbc/cmp/ldh;iron studies/ferritin retic count/- Dr.B  Cc; Ms.Gessner  All questions were answered. The patient knows to call the clinic with any problems, questions or concerns.    GCammie Sickle MD 12/18/2018 12:49 PM

## 2018-12-17 NOTE — Assessment & Plan Note (Deleted)
#  Chronic microcytic anemia of unclear etiology -question iron deficiency versus other causes.  Hemoglobin 11 MCV 73; ferritin 4.6; iron saturations normal. Improved; hb12.3 hold iv iron.  # ?Etiology of IDA- not heavy peroids/ stool occult x2; UA; GI referaal;delclines [facility].   #History of Hodgkin's disease stage II-status post chemotherapy followed by radiation [2005].  Clinically no evidence of recurrence.  Refer to survivorship clinic.   # HTN- previously on liisinopril; sec to angioedema; #Hypertension-poorly [id number] systolic.  Discussed that poorly controlled blood pressures can cause a stroke/heart attacks and other cardiovascular problems. START Norvasc 5mg /day; Checking blood pressures at home frequently.keep a log of blood pressures/and bring it to PCPs attention.    #Hypothyroidism-TSH most recently 9.5.  On syn throidd # DISPOSITION:  # referral to survivorship clinic. [id number] lymphoma # UA today/ stool cardsx3 # follow up in 4 months- MD/labs-cbc/cmp/ldh;iron studies/ferritin retic count/- Dr.B  Cc; Ms.Gessner

## 2018-12-18 ENCOUNTER — Inpatient Hospital Stay: Payer: BC Managed Care – PPO | Attending: Internal Medicine

## 2018-12-18 ENCOUNTER — Telehealth: Payer: Self-pay | Admitting: Family Medicine

## 2018-12-18 ENCOUNTER — Inpatient Hospital Stay (HOSPITAL_BASED_OUTPATIENT_CLINIC_OR_DEPARTMENT_OTHER): Payer: BC Managed Care – PPO | Admitting: Internal Medicine

## 2018-12-18 ENCOUNTER — Other Ambulatory Visit: Payer: Self-pay

## 2018-12-18 VITALS — BP 171/110 | HR 92 | Temp 98.2°F | Wt 171.0 lb

## 2018-12-18 DIAGNOSIS — Z79899 Other long term (current) drug therapy: Secondary | ICD-10-CM | POA: Insufficient documentation

## 2018-12-18 DIAGNOSIS — Z8579 Personal history of other malignant neoplasms of lymphoid, hematopoietic and related tissues: Secondary | ICD-10-CM | POA: Diagnosis not present

## 2018-12-18 DIAGNOSIS — E039 Hypothyroidism, unspecified: Secondary | ICD-10-CM | POA: Diagnosis not present

## 2018-12-18 DIAGNOSIS — D509 Iron deficiency anemia, unspecified: Secondary | ICD-10-CM | POA: Insufficient documentation

## 2018-12-18 DIAGNOSIS — I10 Essential (primary) hypertension: Secondary | ICD-10-CM | POA: Diagnosis not present

## 2018-12-18 DIAGNOSIS — E611 Iron deficiency: Secondary | ICD-10-CM | POA: Insufficient documentation

## 2018-12-18 DIAGNOSIS — D573 Sickle-cell trait: Secondary | ICD-10-CM | POA: Diagnosis not present

## 2018-12-18 DIAGNOSIS — Z8571 Personal history of Hodgkin lymphoma: Secondary | ICD-10-CM | POA: Diagnosis not present

## 2018-12-18 DIAGNOSIS — D500 Iron deficiency anemia secondary to blood loss (chronic): Secondary | ICD-10-CM

## 2018-12-18 LAB — COMPREHENSIVE METABOLIC PANEL
ALT: 17 U/L (ref 0–44)
AST: 18 U/L (ref 15–41)
Albumin: 4.3 g/dL (ref 3.5–5.0)
Alkaline Phosphatase: 51 U/L (ref 38–126)
Anion gap: 10 (ref 5–15)
BUN: 7 mg/dL (ref 6–20)
CO2: 28 mmol/L (ref 22–32)
Calcium: 9.2 mg/dL (ref 8.9–10.3)
Chloride: 102 mmol/L (ref 98–111)
Creatinine, Ser: 0.6 mg/dL (ref 0.44–1.00)
GFR calc Af Amer: >60 mL/min (ref >60)
GFR calc non Af Amer: >60 mL/min (ref >60)
Glucose, Bld: 98 mg/dL (ref 70–99)
Potassium: 3.8 mmol/L (ref 3.5–5.1)
Sodium: 140 mmol/L (ref 135–145)
Total Bilirubin: 0.3 mg/dL (ref 0.3–1.2)
Total Protein: 8.2 g/dL — ABNORMAL HIGH (ref 6.5–8.1)

## 2018-12-18 LAB — RETIC PANEL
Immature Retic Fract: 9.8 % (ref 2.3–15.9)
RBC.: 4.92 MIL/uL (ref 3.87–5.11)
Retic Count, Absolute: 46.2 10*3/uL (ref 19.0–186.0)
Retic Ct Pct: 0.9 % (ref 0.4–3.1)
Reticulocyte Hemoglobin: 30.3 pg (ref >27.9)

## 2018-12-18 LAB — CBC WITH DIFFERENTIAL/PLATELET
Abs Immature Granulocytes: 0.01 10*3/uL (ref 0.00–0.07)
Basophils Absolute: 0 10*3/uL (ref 0.0–0.1)
Basophils Relative: 0 %
Eosinophils Absolute: 0.3 10*3/uL (ref 0.0–0.5)
Eosinophils Relative: 5 %
HCT: 38.7 % (ref 36.0–46.0)
Hemoglobin: 12.3 g/dL (ref 12.0–15.0)
Immature Granulocytes: 0 %
Lymphocytes Relative: 33 %
Lymphs Abs: 1.6 10*3/uL (ref 0.7–4.0)
MCH: 25 pg — ABNORMAL LOW (ref 26.0–34.0)
MCHC: 31.8 g/dL (ref 30.0–36.0)
MCV: 78.7 fL — ABNORMAL LOW (ref 80.0–100.0)
Monocytes Absolute: 0.5 10*3/uL (ref 0.1–1.0)
Monocytes Relative: 10 %
Neutro Abs: 2.6 10*3/uL (ref 1.7–7.7)
Neutrophils Relative %: 52 %
Platelets: 306 10*3/uL (ref 150–400)
RBC: 4.92 MIL/uL (ref 3.87–5.11)
RDW: 14.8 % (ref 11.5–15.5)
WBC: 5 10*3/uL (ref 4.0–10.5)
nRBC: 0 % (ref 0.0–0.2)

## 2018-12-18 LAB — URINALYSIS, COMPLETE (UACMP) WITH MICROSCOPIC
Bacteria, UA: NONE SEEN
Bilirubin Urine: NEGATIVE
Glucose, UA: NEGATIVE mg/dL
Ketones, ur: NEGATIVE mg/dL
Leukocytes,Ua: NEGATIVE
Nitrite: NEGATIVE
Protein, ur: NEGATIVE mg/dL
Specific Gravity, Urine: 1.011 (ref 1.005–1.030)
WBC, UA: NONE SEEN WBC/hpf (ref 0–5)
pH: 6 (ref 5.0–8.0)

## 2018-12-18 LAB — FERRITIN: Ferritin: 11 ng/mL (ref 11–307)

## 2018-12-18 LAB — IRON AND TIBC
Iron: 34 ug/dL (ref 28–170)
Saturation Ratios: 8 % — ABNORMAL LOW (ref 10.4–31.8)
TIBC: 412 ug/dL (ref 250–450)
UIBC: 378 ug/dL

## 2018-12-18 LAB — LACTATE DEHYDROGENASE: LDH: 141 U/L (ref 98–192)

## 2018-12-18 MED ORDER — AMLODIPINE BESYLATE 5 MG PO TABS: 5.0000 mg | ORAL_TABLET | Freq: Every day | ORAL | 0 refills | Status: DC

## 2018-12-18 NOTE — Telephone Encounter (Signed)
Pt scheduled with Debbie 12/20/2018 at 8am  Nothing further needed.

## 2018-12-18 NOTE — Telephone Encounter (Signed)
Patient at other appointment and blood pressure very high. Please call her ASAP to schedule an appointment for Wed.

## 2018-12-18 NOTE — Assessment & Plan Note (Addendum)
#  Microcytic anemia-question iron deficiency/question thalassemia trait.  On p.o. iron.  Hemoglobin improved at 12.2.  Iron studies pending.  Continue p.o. iron for now.  #Etiology of iron deficient anemia is unclear.-No significant menstrual cycles.  Recommend stool occult blood/UA.  Declines GI evaluation [previously [facility]]  #History of Hodgkin's disease stage II-status post chemotherapy followed by radiation [2005].  Clinically no evidence of recurrence.  Refer to survivorship clinic.   # HTN- previously on liisinopril; stopped sec to angioedema; -poorly controlled-160/115  [rechecked].  Discussed that poorly controlled blood pressures can cause a stroke/heart attacks and other cardiovascular problems. START Norvasc 5mg /day; Checking blood pressures at home frequently.keep a log of blood pressures/and bring it to PCPs attention.  Made appointment for the patient to follow-up with PCP this week.  #Hypothyroidism-TSH most recently 9.5.  On Synthroid follow-up PCP  # DISPOSITION:  # referral to survivorship clinic. [id number] lymphoma # UA today/ stool cardsx3 # follow up in 4 months- MD/labs-cbc/cmp/ldh;iron studies/ferritin retic count/- Dr.B  Cc; Ms.Gessner

## 2018-12-18 NOTE — Telephone Encounter (Signed)
Noted  

## 2018-12-20 ENCOUNTER — Ambulatory Visit: Payer: BC Managed Care – PPO | Admitting: Family Medicine

## 2018-12-20 ENCOUNTER — Other Ambulatory Visit: Payer: Self-pay

## 2018-12-20 ENCOUNTER — Encounter: Payer: Self-pay | Admitting: Family Medicine

## 2018-12-20 VITALS — BP 154/98 | HR 109 | Temp 98.2°F | Ht 62.0 in | Wt 170.8 lb

## 2018-12-20 DIAGNOSIS — E039 Hypothyroidism, unspecified: Secondary | ICD-10-CM | POA: Diagnosis not present

## 2018-12-20 DIAGNOSIS — I10 Essential (primary) hypertension: Secondary | ICD-10-CM | POA: Diagnosis not present

## 2018-12-20 DIAGNOSIS — K5903 Drug induced constipation: Secondary | ICD-10-CM | POA: Diagnosis not present

## 2018-12-20 LAB — TSH: TSH: 3.23 u[IU]/mL (ref 0.35–4.50)

## 2018-12-20 NOTE — Patient Instructions (Addendum)
Good to see you today  Please check you blood pressure twice a week, keep a log  Follow up in 4 weeks, bring your log  Can add stool softener  A resource that I like is www.dietdoctor.com/diabetes/diet  I recommend you check your blood sugar daily and keep a log.  Very the time you check your blood sugar such as fasting, before meal, 2 hours after a meal and at bedtime.  Look for trends with the foods you are eating and be a scientist of your body.  Here are some guidelines to help you with meal planning -  Avoid all processed and packaged foods (bread, pasta, crackers, chips, etc) and beverages containing calories.  Avoid added sugars and excessive natural sugars.  Attention to how you feel if you consume artificial sweeteners.  Do they make you more hungry or raise your blood sugar?  With every meal and snack, aim to get 20 g of protein (3 ounces of meat, 4 ounces of fish, 3 eggs, protein powder, 1 cup [place] yogurt, 1 cup cottage cheese, etc.)  Increase fiber in the form of non-starchy vegetables.  These help you feel full with very little carbohydrates and are good for gut health.  Eat 1 serving healthy carb per meal- 1/2 cup brown rice, beans, potato, corn- pay attention to whether or not this significantly raises your blood sugar. If it does, reduce the frequency you consume these.   Eat 2-3 servings of lower sugar fruits daily.  This includes berries, apples, oranges, peaches, pears, one half banana.  Have small amounts of good fats such as avocado, nuts, olive oil, nut butters, olives.  Add a little cheese to your salads to make them tasty.      How to Take Your Blood Pressure You can take your blood pressure at home with a machine. You may need to check your blood pressure at home:  To check if you have high blood pressure (hypertension).  To check your blood pressure over time.  To make sure your blood pressure medicine is working. Supplies needed: You will need a blood  pressure machine, or monitor. You can buy one at a drugstore or online. When choosing one:  Choose one with an arm cuff.  Choose one that wraps around your upper arm. Only one finger should fit between your arm and the cuff.  Do not choose one that measures your blood pressure from your wrist or finger. Your doctor can suggest a monitor. How to prepare Avoid these things for 30 minutes before checking your blood pressure:  Drinking caffeine.  Drinking alcohol.  Eating.  Smoking.  Exercising. Five minutes before checking your blood pressure:  Pee.  Sit in a dining chair. Avoid sitting in a soft couch or armchair.  Be quiet. Do not talk. How to take your blood pressure Follow the instructions that came with your machine. If you have a digital blood pressure monitor, these may be the instructions: 1. Sit up straight. 2. Place your feet on the floor. Do not cross your ankles or legs. 3. Rest your left arm at the level of your heart. You may rest it on a table, desk, or chair. 4. Pull up your shirt sleeve. 5. Wrap the blood pressure cuff around the upper part of your left arm. The cuff should be 1 inch (2.5 cm) above your elbow. It is best to wrap the cuff around bare skin. 6. Fit the cuff snugly around your arm. You should be able  to place only one finger between the cuff and your arm. 7. Put the cord inside the groove of your elbow. 8. Press the power button. 9. Sit quietly while the cuff fills with air and loses air. 10. Write down the numbers on the screen. 11. Wait 2-3 minutes and then repeat steps 1-10. What do the numbers mean? Two numbers make up your blood pressure. The first number is called systolic pressure. The second is called diastolic pressure. An example of a blood pressure reading is "120 over 80" (or 120/80). If you are an adult and do not have a medical condition, use this guide to find out if your blood pressure is normal: Normal  First number: below 120.   Second number: below 80. Elevated  First number: 120-129.  Second number: below 80. Hypertension stage 1  First number: 130-139.  Second number: 80-89. Hypertension stage 2  First number: 140 or above.  Second number: 23 or above. Your blood pressure is above normal even if only the top or bottom number is above normal. Follow these instructions at home:  Check your blood pressure as often as your doctor tells you to.  Take your monitor to your next doctor's appointment. Your doctor will: ? Make sure you are using it correctly. ? Make sure it is working right.  Make sure you understand what your blood pressure numbers should be.  Tell your doctor if your medicines are causing side effects. Contact a doctor if:  Your blood pressure keeps being high. Get help right away if:  Your first blood pressure number is higher than 180.  Your second blood pressure number is higher than 120. This information is not intended to replace advice given to you by your health care provider. Make sure you discuss any questions you have with your health care provider. Document Released: 02/12/2008 Document Revised: 02/11/2017 Document Reviewed: 08/08/2015 Elsevier Patient Education  2020 [employer].

## 2018-12-20 NOTE — Progress Notes (Signed)
Subjective:    Patient ID: Katelyn Cobb, female    DOB: 1985/07/25, 33 y.o.   MRN: [id number]  HPI This is a 33 yo female who presents today for follow up of elevated blood pressure.  She seen a hematology 2 days ago for follow-up of iron deficiency anemia and history of lymphoma.  She was noted to have a very high blood pressure in the office.  Dr. Rogue Bussing started her on amlodipine 5 mg.  She just picked it up yesterday and has not taken it yet.  She was previously on lisinopril for hypertension but had severe angioedema requiring hospitalization.  Subsequently her blood pressure was well controlled off medication.  She denies headaches, chest pain, shortness of breath, leg swelling, visual changes.  A coworker gave her a blood pressure cuff to use at home. She has gained about 10 pounds during the pandemic with working from home.  Her stress level is very high as she has a second grader and 6 grader and has been trying to juggle increased workload.  She has been reading information about DASH diet and is open to making healthier food choices. She has had issues with constipation while taking iron.  She has increased her vegetable intake.  Past Medical History:  Diagnosis Date  . Angioedema   . Cancer ([place])    lymphomoa  . Hypertension   . Hypothyroidism   . Lymphoma in remission ([place]) 01/13/2011  . Lymphoma, Hodgkin's ([place]) 2006   in remission  . Sickle cell trait [facility])    Past Surgical History:  Procedure Laterality Date  . BREAST REDUCTION SURGERY    . LYMPH NODE BIOPSY  2005  . LYMPH NODE BIOPSY  2005   neck  . PORTACATH PLACEMENT  2006   for chemo treatment for lymphoma   Family History  Problem Relation Age of Onset  . Diabetes Other   . Hypertension Other    Social History   Tobacco Use  . Smoking status: Never Smoker  . Smokeless tobacco: Never Used  Substance Use Topics  . Alcohol use: Yes  . Drug use: No      Review of Systems Per HPI    Objective:    Physical Exam Vitals signs reviewed.  Constitutional:      General: She is not in acute distress.    Appearance: Normal appearance. She is obese. She is not ill-appearing, toxic-appearing or diaphoretic.  HENT:     Head: Normocephalic and atraumatic.  Eyes:     Conjunctiva/sclera: Conjunctivae normal.  Cardiovascular:     Rate and Rhythm: Normal rate.  Pulmonary:     Effort: Pulmonary effort is normal.  Neurological:     Mental Status: She is alert and oriented to person, place, and time.  Psychiatric:        Mood and Affect: Mood normal.        Behavior: Behavior normal.        Thought Content: Thought content normal.        Judgment: Judgment normal.          BP (!) 154/98 (BP Location: Left Arm, Patient Position: Sitting, Cuff Size: Normal)   Pulse (!) 109   Temp 98.2 F (36.8 C) (Temporal)   Ht 5\' 2"  (1.575 m)   Wt 170 lb 12.8 oz (77.5 kg)   SpO2 99%   BMI 31.24 kg/m  Wt Readings from Last 3 Encounters:  12/20/18 170 lb 12.8 oz (77.5 kg)  12/18/18 171  lb (77.6 kg)  09/13/18 166 lb (75.3 kg)   BP Readings from Last 3 Encounters:  12/20/18 (!) 154/98  12/18/18 (!) 171/110  07/22/17 (!) 142/96    Assessment & Plan:  1. Hypothyroidism, unspecified type -She reports compliance with levothyroxine - TSH  2. Essential hypertension -She was instructed to start amlodipine 5 mg and to check her blood pressure twice a week x4 weeks -Follow-up in 4 weeks, sooner if blood pressure consistently running high.  If her blood pressures are within normal limits she can schedule a virtual visit instead of coming into the office. -She is provided written information regarding checking her blood pressure as well as healthy diet  3.  Drug-induced constipation -Discussed good sources of fiber and encouraged her to increase these daily -Stool softener daily as needed  Clarene Reamer, FNP-BC  [facility] [place]  12/20/2018 8:30  AM

## 2018-12-22 DIAGNOSIS — D509 Iron deficiency anemia, unspecified: Secondary | ICD-10-CM | POA: Diagnosis not present

## 2018-12-24 DIAGNOSIS — D509 Iron deficiency anemia, unspecified: Secondary | ICD-10-CM | POA: Diagnosis not present

## 2018-12-26 DIAGNOSIS — D509 Iron deficiency anemia, unspecified: Secondary | ICD-10-CM | POA: Diagnosis not present

## 2018-12-27 ENCOUNTER — Other Ambulatory Visit: Payer: Self-pay

## 2018-12-27 DIAGNOSIS — D509 Iron deficiency anemia, unspecified: Secondary | ICD-10-CM

## 2018-12-27 LAB — OCCULT BLOOD X 1 CARD TO LAB, STOOL
Fecal Occult Bld: NEGATIVE
Fecal Occult Bld: NEGATIVE
Fecal Occult Bld: NEGATIVE

## 2019-01-09 ENCOUNTER — Other Ambulatory Visit: Payer: Self-pay | Admitting: Internal Medicine

## 2019-01-14 HISTORY — PX: BREAST BIOPSY: SHX20

## 2019-01-17 ENCOUNTER — Encounter: Payer: Self-pay | Admitting: Family Medicine

## 2019-01-22 ENCOUNTER — Other Ambulatory Visit: Payer: Self-pay | Admitting: Obstetrics and Gynecology

## 2019-01-22 DIAGNOSIS — N6320 Unspecified lump in the left breast, unspecified quadrant: Secondary | ICD-10-CM

## 2019-01-26 ENCOUNTER — Ambulatory Visit: Payer: BC Managed Care – PPO | Admitting: Family Medicine

## 2019-01-26 ENCOUNTER — Other Ambulatory Visit: Payer: Self-pay

## 2019-01-26 ENCOUNTER — Encounter: Payer: Self-pay | Admitting: Family Medicine

## 2019-01-26 VITALS — BP 138/82 | HR 106 | Temp 98.6°F | Ht 62.0 in | Wt 171.8 lb

## 2019-01-26 DIAGNOSIS — Z23 Encounter for immunization: Secondary | ICD-10-CM

## 2019-01-26 DIAGNOSIS — I10 Essential (primary) hypertension: Secondary | ICD-10-CM | POA: Diagnosis not present

## 2019-01-26 MED ORDER — AMLODIPINE BESYLATE 10 MG PO TABS: 10.0000 mg | ORAL_TABLET | Freq: Every day | ORAL | 3 refills | Status: DC

## 2019-01-26 NOTE — Progress Notes (Signed)
Subjective:    Patient ID: Katelyn Cobb, female    DOB: [birth year], 33 y.o.   MRN: [id number]  HPI Chief Complaint  Patient presents with  . Hypertension    Pt states that her BP has gotten a little better since last OV. Still ranging high in the 140s/90s.    This is a 33 yo female who presents today for follow up of HTN. Currently on amlodipine 5 mg. Denies headache, visual change, chest pain, SOB, swelling. She has home readings which are consistently in 140-150s/90s.   Past Medical History:  Diagnosis Date  . Angioedema   . Cancer ([place])    lymphomoa  . Hypertension   . Hypothyroidism   . Lymphoma in remission ([place]) 01/13/2011  . Lymphoma, Hodgkin's ([place]) 2006   in remission  . Sickle cell trait [facility])    Past Surgical History:  Procedure Laterality Date  . BREAST REDUCTION SURGERY    . LYMPH NODE BIOPSY  2005  . LYMPH NODE BIOPSY  2005   neck  . PORTACATH PLACEMENT  2006   for chemo treatment for lymphoma   Family History  Problem Relation Age of Onset  . Diabetes Other   . Hypertension Other    Social History   Tobacco Use  . Smoking status: Never Smoker  . Smokeless tobacco: Never Used  Substance Use Topics  . Alcohol use: Yes  . Drug use: No    Review of Systems Per HPI    Objective:   Physical Exam Vitals signs reviewed.  Constitutional:      General: She is not in acute distress.    Appearance: Normal appearance. She is obese. She is not ill-appearing, toxic-appearing or diaphoretic.  HENT:     Head: Normocephalic and atraumatic.  Eyes:     Conjunctiva/sclera: Conjunctivae normal.  Pulmonary:     Effort: Pulmonary effort is normal.  Musculoskeletal:     Right lower leg: No edema.     Left lower leg: No edema.  Neurological:     Mental Status: She is alert and oriented to person, place, and time.  Psychiatric:        Mood and Affect: Mood normal.        Behavior: Behavior normal.        Thought Content: Thought content normal.      Judgment: Judgment normal.      BP 138/82 (BP Location: Left Arm, Patient Position: Sitting, Cuff Size: Normal)   Pulse (!) 106   Temp 98.6 F (37 C) (Temporal)   Ht 5\' 2"  (1.575 m)   Wt 171 lb 12.8 oz (77.9 kg)   SpO2 99%   BMI 31.42 kg/m   BP Readings from Last 3 Encounters:  01/26/19 138/82  12/20/18 (!) 154/98  12/18/18 (!) 171/110   Wt Readings from Last 3 Encounters:  01/26/19 171 lb 12.8 oz (77.9 kg)  12/20/18 170 lb 12.8 oz (77.5 kg)  12/18/18 171 lb (77.6 kg)        Assessment & Plan:  1. Essential hypertension - BP significantly improved, tolerating amlodipine 5 mg, home readings not at goal, will increase her amlodipine to 10 mg daily, she will take 2 of her 5 mg tablets until gone then take one 10 mg tablet daily.  - She will continue to check home readings twice a week for 4 weeks then send me her readings via Mychart - follow up in 6 months  2. Need for influenza vaccination - Flu  Vaccine QUAD 6+ mos PF IM (Fluarix Quad PF)   Clarene Reamer, FNP-BC  [facility] at [facility], [facility]  01/27/2019 6:28 AM

## 2019-01-26 NOTE — Progress Notes (Addendum)
   Subjective:    Patient ID: Katelyn Cobb, female    DOB: 1985-04-26, 33 y.o.   MRN: [id number]   Chief Complaint  Patient presents with  . Hypertension    Pt states that her BP has gotten a little better since last OV. Still ranging high in the 140s/90s.    HPI This is 33 yo [place] American who comes to the office for BP follow up. Pt checked her BP at home twice a week and her BP runs in 140s/112  Pt denies any headache or vision changes. Patient does not have any questions or concerns at this point.   Past Medical History:  Diagnosis Date  . Angioedema   . Cancer ([place])    lymphomoa  . Hypertension   . Hypothyroidism   . Lymphoma in remission ([place]) 01/13/2011  . Lymphoma, Hodgkin's ([place]) 2006   in remission  . Sickle cell trait [facility])    Past Surgical History:  Procedure Laterality Date  . BREAST REDUCTION SURGERY    . LYMPH NODE BIOPSY  2005  . LYMPH NODE BIOPSY  2005   neck  . PORTACATH PLACEMENT  2006   for chemo treatment for lymphoma   Review of Systems  Constitutional: Negative for activity change.  Eyes: Negative for pain and visual disturbance.  Respiratory: Negative for chest tightness, shortness of breath and wheezing.   Cardiovascular: Negative for chest pain, palpitations and leg swelling.  Gastrointestinal: Negative for abdominal pain, blood in stool, constipation, diarrhea, nausea and vomiting.  Genitourinary: Negative for difficulty urinating.  Musculoskeletal: Negative.   Neurological: Negative for weakness and headaches.       Wt Readings from Last 3 Encounters:  01/26/19 77.9 kg  12/20/18 77.5 kg  12/18/18 77.6 kg   BP Readings from Last 3 Encounters:  01/26/19 138/82  12/20/18 (!) 154/98  12/18/18 (!) 171/110   Objective:   Physical Exam Constitutional:      General: She is not in acute distress.    Appearance: Normal appearance. She is normal weight. She is not ill-appearing.  HENT:     Head: Normocephalic and atraumatic.  Neck:      Musculoskeletal: Normal range of motion and neck supple.  Cardiovascular:     Rate and Rhythm: Normal rate and regular rhythm.     Pulses: Normal pulses.     Heart sounds: Normal heart sounds. No murmur. No friction rub. No gallop.   Musculoskeletal:        General: No swelling.     Right lower leg: No edema.     Left lower leg: No edema.  Skin:    General: Skin is warm and dry.  Neurological:     Mental Status: She is alert.       Assessment & Plan:  1. Essential hypertension Improving. Pt advised to increase Amlodipine dose up to 10 mg and continue to record her blood pressures at home  2. Need for influenza vaccination - Flu Vaccine QUAD 6+ mos PF IM (Fluarix Quad PF)

## 2019-01-26 NOTE — Patient Instructions (Addendum)
Good to see you today  Please increase your amlodipine to 10 mg a day. Take 2 of your 5 mg tablets until you run out then start 10 mg dose  Check your blood pressure twice a week for 4 weeks and send me a mychart message with a picture of the readings.    Follow up in 6 months

## 2019-01-27 DIAGNOSIS — G43909 Migraine, unspecified, not intractable, without status migrainosus: Secondary | ICD-10-CM | POA: Insufficient documentation

## 2019-01-30 ENCOUNTER — Other Ambulatory Visit: Payer: Self-pay | Admitting: Obstetrics and Gynecology

## 2019-01-30 ENCOUNTER — Ambulatory Visit
Admission: RE | Admit: 2019-01-30 | Discharge: 2019-01-30 | Disposition: A | Discharge status: Home / Self Care | Payer: BC Managed Care – PPO | Source: Ambulatory Visit | Attending: Obstetrics and Gynecology | Admitting: Obstetrics and Gynecology

## 2019-01-30 ENCOUNTER — Other Ambulatory Visit: Payer: Self-pay

## 2019-01-30 DIAGNOSIS — N6320 Unspecified lump in the left breast, unspecified quadrant: Secondary | ICD-10-CM

## 2019-01-31 ENCOUNTER — Ambulatory Visit
Admission: RE | Admit: 2019-01-31 | Discharge: 2019-01-31 | Disposition: A | Discharge status: Home / Self Care | Payer: BC Managed Care – PPO | Source: Ambulatory Visit | Attending: Obstetrics and Gynecology | Admitting: Obstetrics and Gynecology

## 2019-01-31 DIAGNOSIS — N6320 Unspecified lump in the left breast, unspecified quadrant: Secondary | ICD-10-CM

## 2019-02-26 ENCOUNTER — Ambulatory Visit (INDEPENDENT_AMBULATORY_CARE_PROVIDER_SITE_OTHER): Payer: BC Managed Care – PPO | Admitting: Family Medicine

## 2019-02-26 ENCOUNTER — Other Ambulatory Visit: Payer: Self-pay

## 2019-02-26 ENCOUNTER — Telehealth: Payer: Self-pay

## 2019-02-26 ENCOUNTER — Encounter: Payer: Self-pay | Admitting: Family Medicine

## 2019-02-26 VITALS — Ht 62.0 in | Wt 168.0 lb

## 2019-02-26 DIAGNOSIS — Z20828 Contact with and (suspected) exposure to other viral communicable diseases: Secondary | ICD-10-CM | POA: Diagnosis not present

## 2019-02-26 DIAGNOSIS — R058 Other specified cough: Secondary | ICD-10-CM

## 2019-02-26 DIAGNOSIS — R05 Cough: Secondary | ICD-10-CM

## 2019-02-26 DIAGNOSIS — Z20822 Contact with and (suspected) exposure to covid-19: Secondary | ICD-10-CM

## 2019-02-26 NOTE — Progress Notes (Signed)
Virtual Visit via Video Note  I connected with Katelyn Cobb on 02/26/19 at  2:00 PM EST by a video enabled telemedicine application and verified that I am speaking with the correct person using two identifiers.  Location: Patient: In her home Provider: [place]   I discussed the limitations of evaluation and management by telemedicine and the availability of in person appointments. The patient expressed understanding and agreed to proceed.  History of Present Illness: Chief Complaint  Patient presents with  . Cough    Possible exposure to Covid -  pt has dry cough x 1 week, denies sore throat, wheezing, SOB. No congestion in her head or chest or chest tightness. Pt is wanting to know if she needs to get tested.    This is a 33 yo female who presents today for a virtual visit for the above cc.  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  The patient reports that she works on the other side of the building from the positive individual.  She did have a conversation with her 4 days ago but they were both wearing masks and practiced social distancing.  She is able to work from home and her place of employment will be closed for the next 2 weeks.  Patient reports that she has a very occasional dry cough for about a week.  She has not had any fever, chills, shortness of breath, wheeze, head congestion, chest congestion, headache, body aches.  She has seasonal allergies and thought her cough was likely related to this.  She is concerned about whether or not she should be tested as one of her children does attend in person school.  She is waiting for guidance from her employer regarding testing.   Past Medical History:  Diagnosis Date  . Angioedema   . Cancer ([place])    lymphomoa  . Hypertension   . Hypothyroidism   . Lymphoma in remission ([place]) 01/13/2011  . Lymphoma, Hodgkin's ([place]) 2006   in remission  . Sickle cell trait [facility])     Past Surgical History:  Procedure Laterality Date  . BREAST REDUCTION SURGERY    . LYMPH NODE BIOPSY  2005  . LYMPH NODE BIOPSY  2005   neck  . PORTACATH PLACEMENT  2006   for chemo treatment for lymphoma   Family History  Problem Relation Age of Onset  . Diabetes Other   . Hypertension Other    Social History   Tobacco Use  . Smoking status: Never Smoker  . Smokeless tobacco: Never Used  Substance Use Topics  . Alcohol use: Yes  . Drug use: No      Observations/Objective: The patient is alert and answers questions appropriately.  Visible skin is unremarkable.  Respirations are even and unlabored without wheeze or witnessed cough.  Mood and affect are appropriate. Ht 5\' 2"  (1.575 m)   Wt 168 lb (76.2 kg)   BMI 30.73 kg/m   Assessment and Plan: 1. Exposure to COVID-19 virus -Reviewed recommendations with patient and as she and her coworker were socially distanced and both wore masks, she does not need to be tested unless she develops symptoms.  She will also clarify with her employer their recommendations.  2. Dry cough -Symptomatic treatment.  Follow-up if any worsening or new symptoms develop.   Clarene Reamer, FNP-BC  [facility] at [facility], [facility]  02/26/2019 1:16 PM  Follow Up Instructions:    I discussed the assessment and treatment plan with the patient. The patient was provided an opportunity to ask questions and all were answered. The patient agreed with the plan and demonstrated an understanding of the instructions.   The patient was advised to call back or seek an in-person evaluation if the symptoms worsen or if the condition fails to improve as anticipated.   Elby Beck, FNP

## 2019-02-26 NOTE — Telephone Encounter (Signed)
Pt left v/m that where pt works all employees were sent home today due to exposure of + covid person. pts employer wants all workers tested. I spoke with pt; pt does not have fever or chills,no muscle or joint pain,No severe h/a or dizziness. No weakness,rash or SOB; no abd pain or diarrhea and no loss of taste or smell; no red or pink eye, S/T or runny nose. No bruising or bleeding,fatigue or vomiting. Pt has had a dry cough since last wk.No travel and no known exposure to covid. Pt does wear a mask at work and social distancing. Pt scheduled a virtual visit with Glenda Chroman FNP 02/26/19 at 2 PM. Pt will have vitals ready when CMA calls.FYI to Glenda Chroman FNP.

## 2019-02-26 NOTE — Telephone Encounter (Signed)
Noted  

## 2019-04-23 ENCOUNTER — Inpatient Hospital Stay: Payer: BC Managed Care – PPO

## 2019-04-23 ENCOUNTER — Inpatient Hospital Stay: Payer: BC Managed Care – PPO | Admitting: Internal Medicine

## 2019-05-09 ENCOUNTER — Inpatient Hospital Stay: Payer: BC Managed Care – PPO | Attending: Internal Medicine

## 2019-05-09 ENCOUNTER — Inpatient Hospital Stay: Payer: BC Managed Care – PPO | Admitting: Internal Medicine

## 2019-07-27 ENCOUNTER — Encounter: Payer: Self-pay | Admitting: Family Medicine

## 2019-07-27 ENCOUNTER — Ambulatory Visit: Payer: BC Managed Care – PPO | Admitting: Family Medicine

## 2019-07-27 ENCOUNTER — Other Ambulatory Visit: Payer: Self-pay

## 2019-07-27 VITALS — BP 128/82 | HR 90 | Temp 98.3°F | Ht 62.0 in | Wt 173.8 lb

## 2019-07-27 DIAGNOSIS — E6609 Other obesity due to excess calories: Secondary | ICD-10-CM | POA: Diagnosis not present

## 2019-07-27 DIAGNOSIS — I10 Essential (primary) hypertension: Secondary | ICD-10-CM

## 2019-07-27 DIAGNOSIS — D509 Iron deficiency anemia, unspecified: Secondary | ICD-10-CM | POA: Diagnosis not present

## 2019-07-27 DIAGNOSIS — Z6831 Body mass index (BMI) 31.0-31.9, adult: Secondary | ICD-10-CM | POA: Diagnosis not present

## 2019-07-27 LAB — CBC WITH DIFFERENTIAL/PLATELET
Basophils Absolute: 0 10*3/uL (ref 0.0–0.1)
Basophils Relative: 0.6 % (ref 0.0–3.0)
Eosinophils Absolute: 0.2 10*3/uL (ref 0.0–0.7)
Eosinophils Relative: 3.9 % (ref 0.0–5.0)
HCT: 37.9 % (ref 36.0–46.0)
Hemoglobin: 12.4 g/dL (ref 12.0–15.0)
Lymphocytes Relative: 43.5 % (ref 12.0–46.0)
Lymphs Abs: 2.3 10*3/uL (ref 0.7–4.0)
MCHC: 32.7 g/dL (ref 30.0–36.0)
MCV: 80 fl (ref 78.0–100.0)
Monocytes Absolute: 0.5 10*3/uL (ref 0.1–1.0)
Monocytes Relative: 9.6 % (ref 3.0–12.0)
Neutro Abs: 2.3 10*3/uL (ref 1.4–7.7)
Neutrophils Relative %: 42.4 % — ABNORMAL LOW (ref 43.0–77.0)
Platelets: 326 10*3/uL (ref 150.0–400.0)
RBC: 4.73 Mil/uL (ref 3.87–5.11)
RDW: 14.9 % (ref 11.5–15.5)
WBC: 5.4 10*3/uL (ref 4.0–10.5)

## 2019-07-27 LAB — COMPREHENSIVE METABOLIC PANEL
ALT: 16 U/L (ref 0–35)
AST: 17 U/L (ref 0–37)
Albumin: 4.4 g/dL (ref 3.5–5.2)
Alkaline Phosphatase: 67 U/L (ref 39–117)
BUN: 10 mg/dL (ref 6–23)
CO2: 31 mEq/L (ref 19–32)
Calcium: 9.7 mg/dL (ref 8.4–10.5)
Chloride: 103 mEq/L (ref 96–112)
Creatinine, Ser: 0.66 mg/dL (ref 0.40–1.20)
GFR: 122.6 mL/min (ref >60.00)
Glucose, Bld: 129 mg/dL — ABNORMAL HIGH (ref 70–99)
Potassium: 3.2 mEq/L — ABNORMAL LOW (ref 3.5–5.1)
Sodium: 139 mEq/L (ref 135–145)
Total Bilirubin: 0.2 mg/dL (ref 0.2–1.2)
Total Protein: 7.7 g/dL (ref 6.0–8.3)

## 2019-07-27 LAB — LIPID PANEL
Cholesterol: 147 mg/dL (ref 0–200)
HDL: 49 mg/dL (ref >39.00)
LDL Cholesterol: 80 mg/dL (ref 0–99)
NonHDL: 97.7
Total CHOL/HDL Ratio: 3
Triglycerides: 89 mg/dL (ref 0.0–149.0)
VLDL: 17.8 mg/dL (ref 0.0–40.0)

## 2019-07-27 LAB — HEMOGLOBIN A1C: Hgb A1c MFr Bld: 5.6 % (ref 4.6–6.5)

## 2019-07-27 NOTE — Progress Notes (Signed)
   Subjective:    Patient ID: Katelyn Cobb, female    DOB: February 08, 1986, 34 y.o.   MRN: [id number]  HPI Chief Complaint  Patient presents with  . Hypertension    Pt has not been checking BP consistently. Pt dneies any concerns today.    This is a 34 yo female who presents today for follow up of chronic medical conditions.   HTN-has not been taking home blood pressure readings.  Continues to take medication and denies any side effects.  Anemia-has not had recent follow-up with hematology, missed her last appointment.  Has been feeling pretty well, no significant issues with energy or fatigue.  Weight- walking, occasional small amount soda, eats potato chips, "kid food."    Review of Systems Per HPI    Objective:   Physical Exam Physical Exam  Constitutional: Oriented to person, place, and time. She appears well-developed and well-nourished.  HENT:  Head: Normocephalic and atraumatic.  Eyes: Conjunctivae are normal.  Neck: Normal range of motion. Neck supple.  Cardiovascular: Normal rate, regular rhythm and normal heart sounds.   Pulmonary/Chest: Effort normal and breath sounds normal.  Musculoskeletal: No LE edema.  Neurological: Alert and oriented to person, place, and time.  Skin: Skin is warm and dry.  Psychiatric: Normal mood and affect. Behavior is normal. Judgment and thought content normal.  Vitals reviewed.     BP 128/82 (BP Location: Left Arm, Patient Position: Sitting, Cuff Size: Normal)   Pulse 90   Temp 98.3 F (36.8 C) (Temporal)   Ht 5\' 2"  (1.575 m)   Wt 173 lb 12.8 oz (78.8 kg)   SpO2 98%   BMI 31.79 kg/m  Wt Readings from Last 3 Encounters:  07/27/19 173 lb 12.8 oz (78.8 kg)  02/26/19 168 lb (76.2 kg)  01/26/19 171 lb 12.8 oz (77.9 kg)       Assessment & Plan:  1. Essential hypertension -Well-controlled - CBC with Differential - Hemoglobin A1c - Comprehensive metabolic panel - Lipid Panel  2. Iron deficiency anemia, unspecified iron  deficiency anemia type - CBC with Differential  3. Class 1 obesity due to excess calories with serious comorbidity and body mass index (BMI) of 31.0 to 31.9 in adult -Discussed her diet and provided verbal and written information regarding balanced food plan  -Follow-up in 6 months  This visit occurred during the SARS-CoV-2 public health emergency.  Safety protocols were in place, including screening questions prior to the visit, additional usage of staff PPE, and extensive cleaning of exam room while observing appropriate contact time as indicated for disinfecting solutions.      Clarene Reamer, FNP-BC   Primary Care at [facility], [facility]  07/28/2019 8:27 AM

## 2019-07-27 NOTE — Patient Instructions (Signed)
Www.dietdoctor.com    A resource that I like is www.dietdoctor.com/diabetes/diet  Here are some guidelines to help you with meal planning -  Avoid all processed and packaged foods (bread, pasta, crackers, chips, etc) and beverages containing calories.  Avoid added sugars and excessive natural sugars.  Attention to how you feel if you consume artificial sweeteners.  Do they make you more hungry or raise your blood sugar?  With every meal and snack, aim to get 20 g of protein (3 ounces of meat, 4 ounces of fish, 3 eggs, protein powder, 1 cup [place] yogurt, 1 cup cottage cheese, etc.)  Increase fiber in the form of non-starchy vegetables.  These help you feel full with very little carbohydrates and are good for gut health.  Eat 1 serving healthy carb per meal- 1/2 cup brown rice, beans, potato, corn- pay attention to whether or not this significantly raises your blood sugar. If it does, reduce the frequency you consume these.   Eat 2-3 servings of lower sugar fruits daily.  This includes berries, apples, oranges, peaches, pears, one half banana.  Have small amounts of good fats such as avocado, nuts, olive oil, nut butters, olives.  Add a little cheese to your salads to make them tasty.

## 2019-11-08 ENCOUNTER — Other Ambulatory Visit: Payer: Self-pay | Admitting: Family Medicine

## 2019-11-08 DIAGNOSIS — E039 Hypothyroidism, unspecified: Secondary | ICD-10-CM

## 2019-11-16 ENCOUNTER — Other Ambulatory Visit: Payer: BC Managed Care – PPO

## 2019-11-16 ENCOUNTER — Other Ambulatory Visit: Payer: Self-pay

## 2019-11-16 DIAGNOSIS — Z20822 Contact with and (suspected) exposure to covid-19: Secondary | ICD-10-CM

## 2019-11-17 LAB — NOVEL CORONAVIRUS, NAA: SARS-CoV-2, NAA: NOT DETECTED

## 2019-11-27 ENCOUNTER — Encounter: Payer: Self-pay | Admitting: Family Medicine

## 2019-11-27 NOTE — Telephone Encounter (Signed)
Last office visit 07/27/2019 for HTN. Last refilled ?  Listed as historical medication.  No future appointments.  Ok to refill?

## 2019-11-28 ENCOUNTER — Other Ambulatory Visit: Payer: Self-pay | Admitting: Family Medicine

## 2019-11-28 MED ORDER — TRIAMCINOLONE ACETONIDE 0.1 % EX CREA: 1.0000 "application " | TOPICAL_CREAM | Freq: Two times a day (BID) | CUTANEOUS | 1 refills | Status: DC | PRN

## 2020-01-22 ENCOUNTER — Other Ambulatory Visit: Payer: Self-pay | Admitting: Obstetrics and Gynecology

## 2020-01-22 DIAGNOSIS — N6310 Unspecified lump in the right breast, unspecified quadrant: Secondary | ICD-10-CM

## 2020-02-04 ENCOUNTER — Ambulatory Visit
Admission: RE | Admit: 2020-02-04 | Discharge: 2020-02-04 | Disposition: A | Discharge status: Home / Self Care | Payer: BC Managed Care – PPO | Source: Ambulatory Visit | Attending: Obstetrics and Gynecology | Admitting: Obstetrics and Gynecology

## 2020-02-04 ENCOUNTER — Other Ambulatory Visit: Payer: Self-pay

## 2020-02-04 ENCOUNTER — Other Ambulatory Visit: Payer: Self-pay | Admitting: Obstetrics and Gynecology

## 2020-02-04 DIAGNOSIS — N6310 Unspecified lump in the right breast, unspecified quadrant: Secondary | ICD-10-CM

## 2020-02-08 ENCOUNTER — Other Ambulatory Visit: Payer: Self-pay | Admitting: Family Medicine

## 2020-02-11 NOTE — Telephone Encounter (Signed)
Pharmacy requests refill on: Amlodipine Besylate 10 mg  LAST REFILL: 01/26/2019 LAST OV: 07/27/2019 NEXT OV: Not Scheduled  PHARMACY: [facility] [place], [place]

## 2020-02-12 ENCOUNTER — Ambulatory Visit
Admission: RE | Admit: 2020-02-12 | Discharge: 2020-02-12 | Disposition: A | Discharge status: Home / Self Care | Payer: BC Managed Care – PPO | Source: Ambulatory Visit | Attending: Obstetrics and Gynecology | Admitting: Obstetrics and Gynecology

## 2020-02-12 ENCOUNTER — Other Ambulatory Visit: Payer: Self-pay

## 2020-02-12 DIAGNOSIS — N6310 Unspecified lump in the right breast, unspecified quadrant: Secondary | ICD-10-CM

## 2020-02-14 ENCOUNTER — Other Ambulatory Visit: Payer: Self-pay | Admitting: Family Medicine

## 2020-02-14 DIAGNOSIS — E039 Hypothyroidism, unspecified: Secondary | ICD-10-CM

## 2020-02-14 NOTE — Telephone Encounter (Signed)
Pharmacy requests refill on: Levothyroxine 88 mcg   LAST REFILL: 11/08/2019 (Q-90, R-0) LAST OV: 07/27/2019 NEXT OV: Not Scheduled  PHARMACY: [id number] [place], [place]  TSH (12/20/2018): 3.23

## 2020-03-01 IMAGING — MG DIGITAL DIAGNOSTIC BILAT W/ TOMO W/ CAD
8 of 15 series · 8 of 40 positions shown · non-contrast
Comparison: Previous exam(s).

CLINICAL DATA: 33-year-old female presenting for evaluation of a
palpable lump in the left breast identified on self breast exam. The
patient states that the doctor palpated a separate mass closer to
the nipple in the upper-outer left breast than the area that she
identified. The patient has history of bilateral reduction
mammoplasty 2 years ago.

EXAM:
DIGITAL DIAGNOSTIC BILATERAL MAMMOGRAM WITH CAD AND TOMO
ULTRASOUND LEFT BREAST

[L MLO synth-2D (1 of 2)]
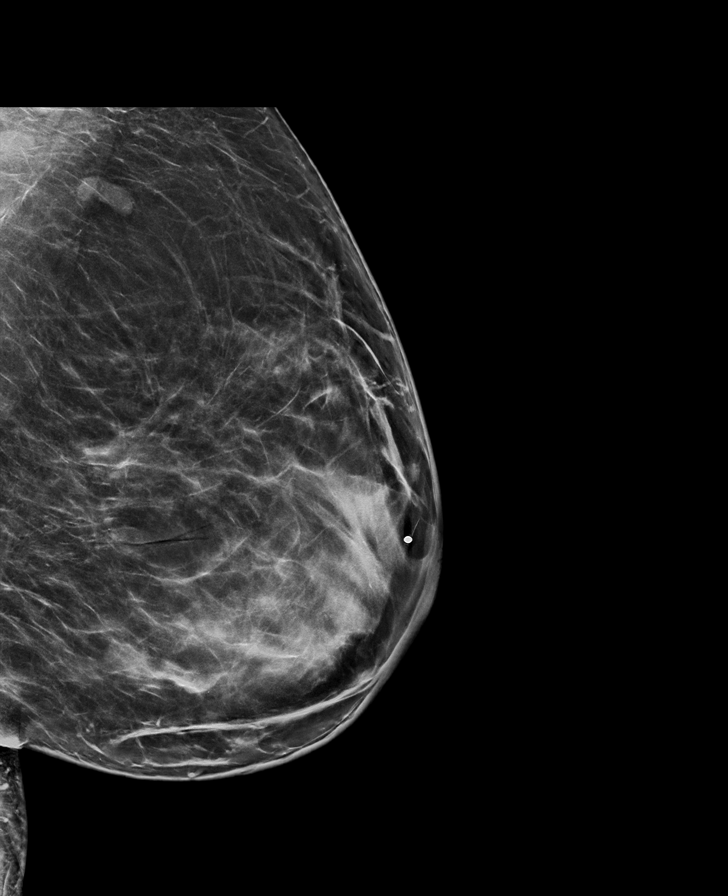

[L CC synth-2D (1 of 2)]
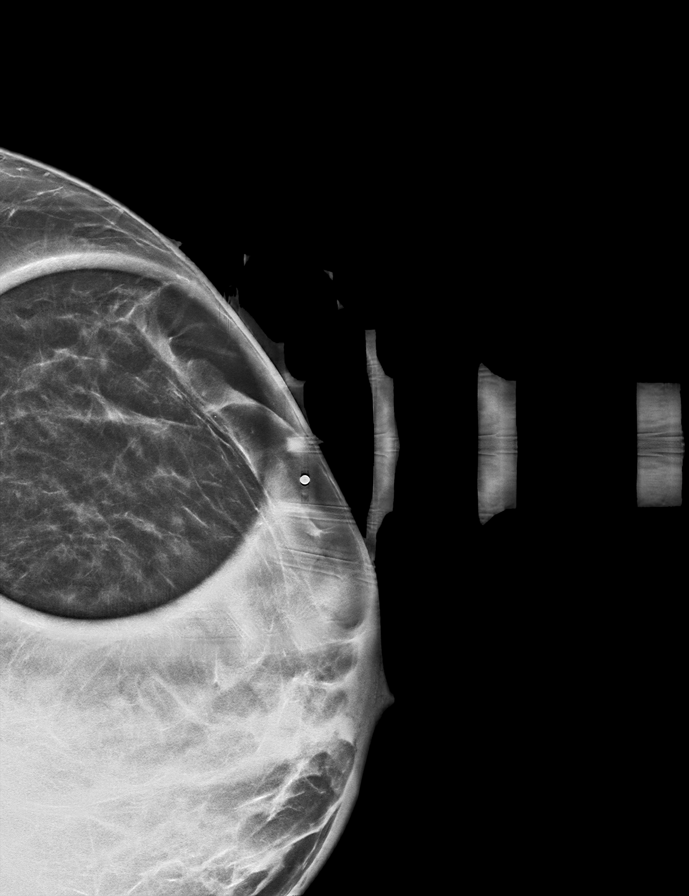

[L TAN synth-2D]
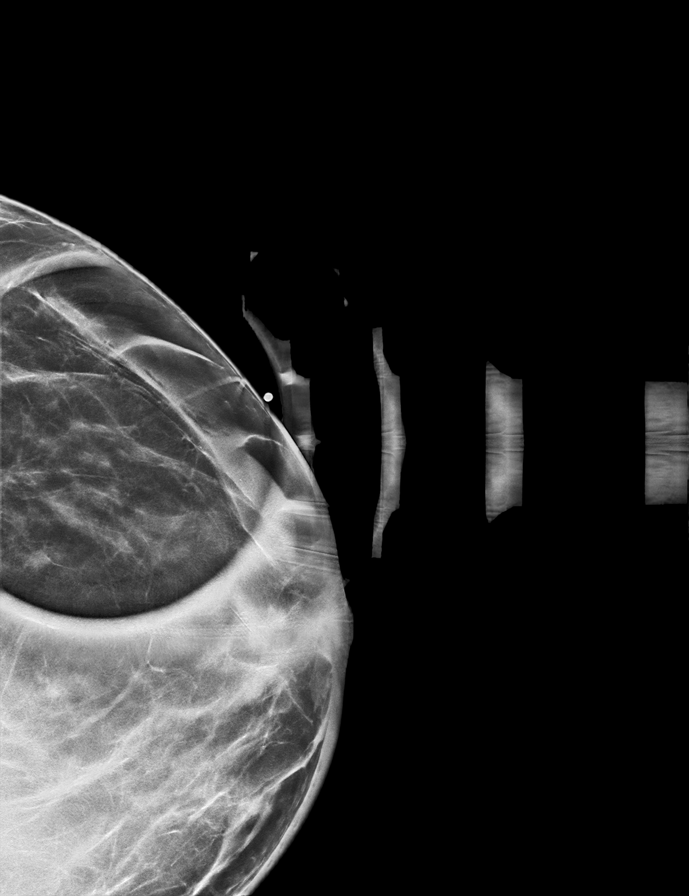

[R MLO synth-2D]
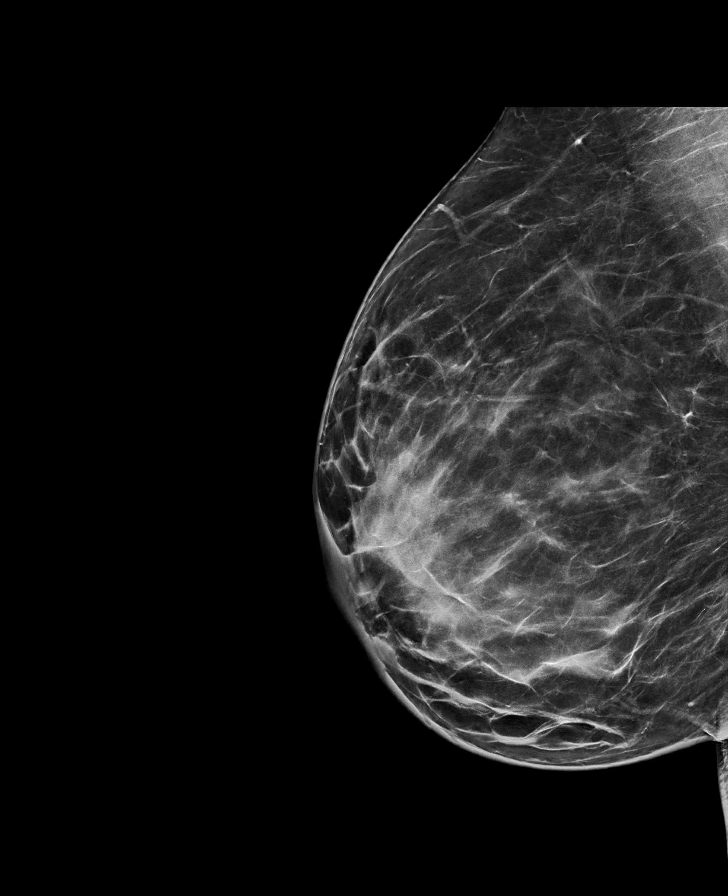

[R CC synth-2D]
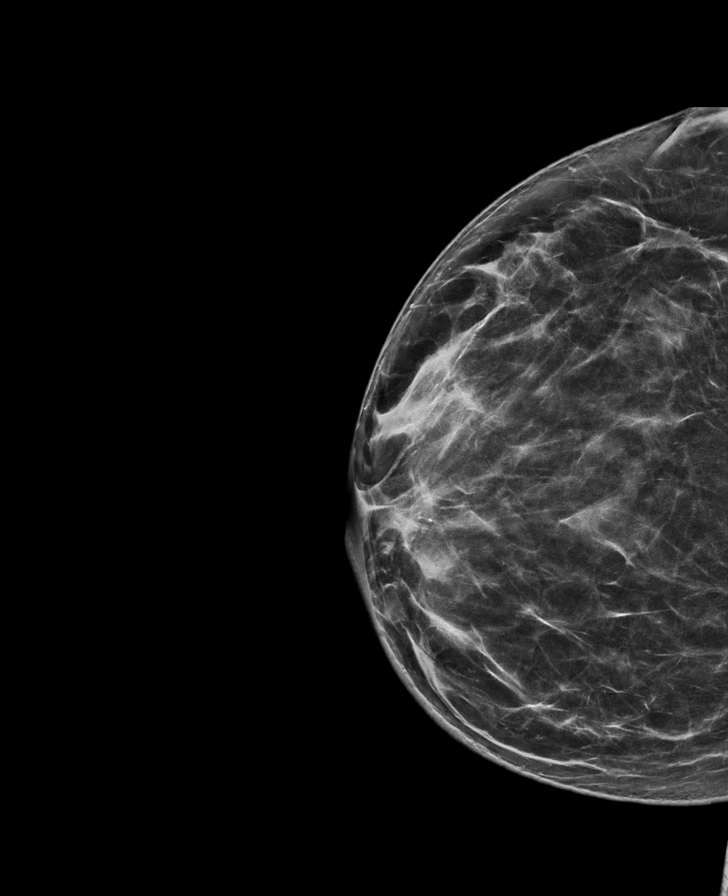

[L MLO synth-2D (2 of 2)]
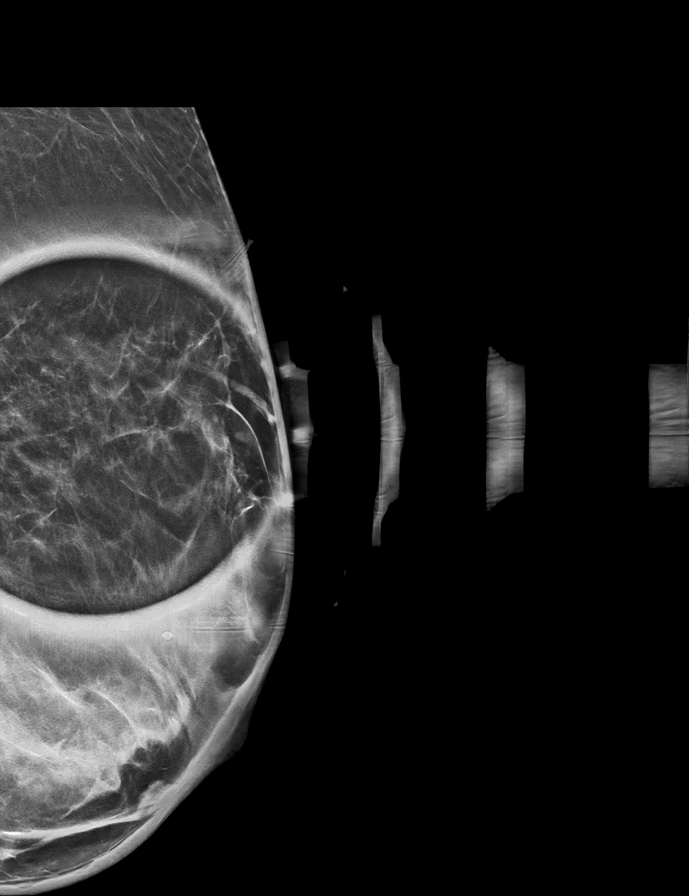

[L CC synth-2D (2 of 2)]
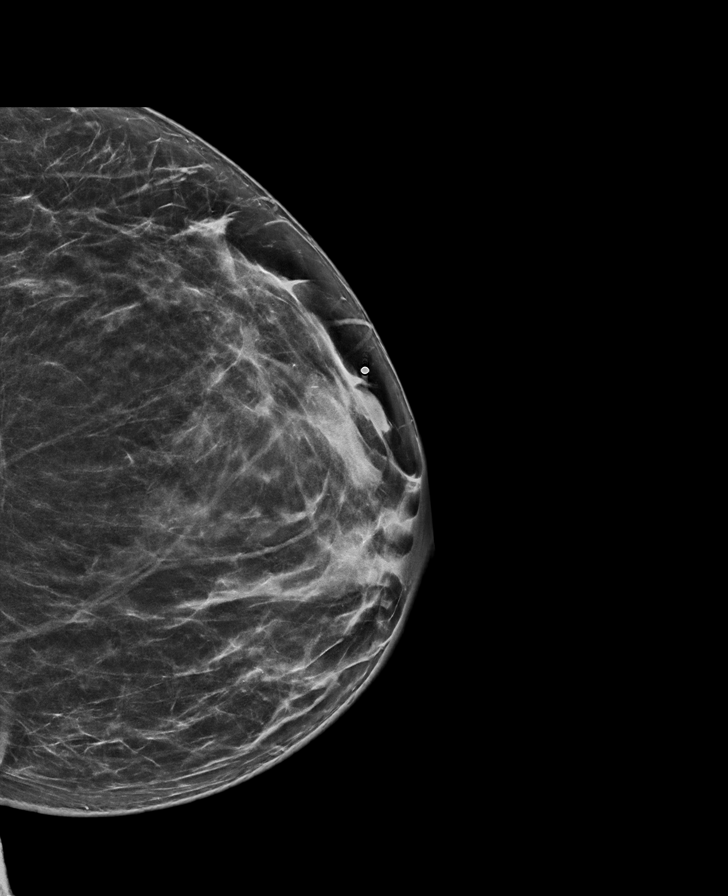

[L CC tomo · tomo slice 40/59.0]
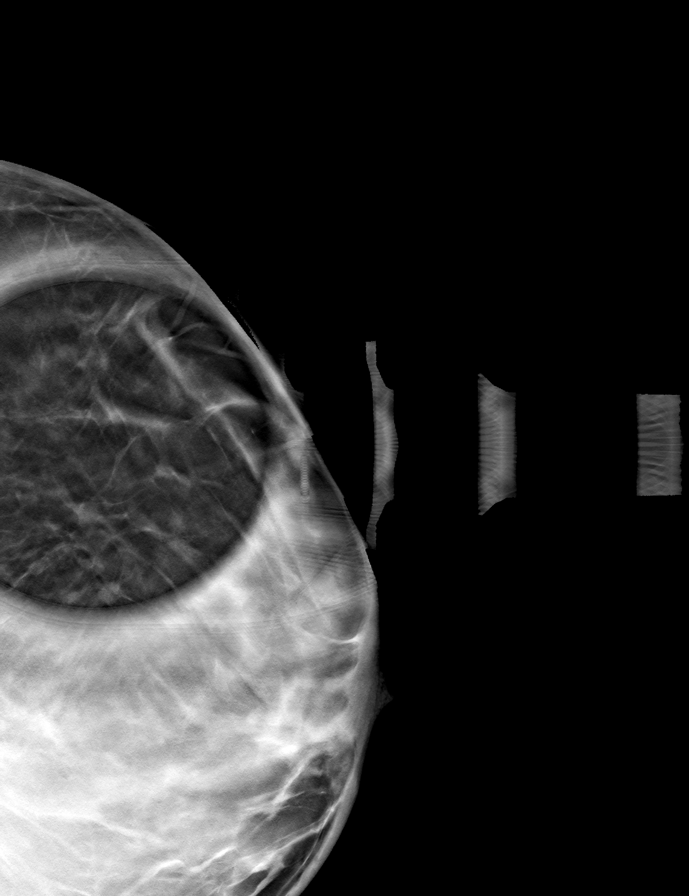

[8 of 40 positions shown; findings below may reference images not displayed]

ACR Breast Density Category c: The breast tissue is heterogeneously
dense, which may obscure small masses.
FINDINGS: Spot compression tomosynthesis images of the upper outer left breast
at the palpable site of concern demonstrates a possible superficial
oval circumscribed mass measuring 1.5 cm. Slightly deeper within the
breast tissue, in the upper-outer left breast, middle to anterior
depth there is a small irregular asymmetry measuring 0.5 cm. This
asymmetry is not definitely seen on the MLO or true lateral view. No
other suspicious calcifications, masses or areas of distortion are
seen in the bilateral breasts.

Mammographic images were processed with CAD.

No discrete palpable masses are identified in the upper-outer left
breast.

Ultrasound targeted to the left breast in the region of the palpable
area identified on clinical breast exam demonstrates normal
fibroglandular tissue. The breast was scanned between 2 and 4
o'clock at 3-4 cm from the nipple. Ultrasound targeted to the
palpable area identified by the patient at 2 o'clock, 9 cm from the
nipple demonstrates normal fibroglandular tissue. No suspicious
masses or areas of shadowing are identified. No sonographic findings
are seen in the upper-outer left breast to correspond with the
asymmetry identified mammographically. Ultrasound of the left axilla
demonstrates multiple normal-appearing lymph nodes.
IMPRESSION: 1. There is a 0.5 cm nodular asymmetry in the upper-outer left
breast, only well seen on the CC image, which is indeterminate.

2. No mammographic or targeted sonographic abnormalities are
identified at the palpable sites in the upper-outer left breast.

3.  No evidence of left axillary lymphadenopathy.

4.  No mammographic evidence of right breast malignancy.

RECOMMENDATION:
Stereotactic biopsy is recommended for the left breast asymmetry.
This has been scheduled for 01/31/2019 at [DATE] a.m.

I have discussed the findings and recommendations with the patient.
If applicable, a reminder letter will be sent to the patient
regarding the next appointment.

BI-RADS CATEGORY  4: Suspicious.

## 2020-04-03 ENCOUNTER — Telehealth: Payer: Self-pay | Admitting: Family Medicine

## 2020-04-03 DIAGNOSIS — E039 Hypothyroidism, unspecified: Secondary | ICD-10-CM

## 2020-04-03 MED ORDER — LEVOTHYROXINE SODIUM 88 MCG PO TABS: 88.0000 ug | ORAL_TABLET | Freq: Every day | ORAL | 0 refills | Status: DC

## 2020-04-03 NOTE — Telephone Encounter (Signed)
1.Medication Requested: levothyroxine (SYNTHROID) 88 MCG tablet  2. Pharmacy ([place], [place]):  [id number] [place] [address] Phone:  [phone]  Fax:  [phone]     3. On Med List: Yes     4. Last Visit with PCP: 5.14.2021   5. Next visit date with PCP: N/A   Agent: Please be advised that RX refills may take up to 3 business days. We ask that you follow-up with your pharmacy.

## 2020-04-03 NOTE — Telephone Encounter (Signed)
30 day supply sent to pharmacy.   Routing to front office staff to have patient schedule TOC

## 2020-05-06 ENCOUNTER — Other Ambulatory Visit: Payer: Self-pay | Admitting: Family Medicine

## 2020-05-06 DIAGNOSIS — E039 Hypothyroidism, unspecified: Secondary | ICD-10-CM

## 2020-05-06 NOTE — Telephone Encounter (Signed)
Pt has TOC appt with Dr. Damita Dunnings.

## 2020-05-09 ENCOUNTER — Ambulatory Visit (INDEPENDENT_AMBULATORY_CARE_PROVIDER_SITE_OTHER): Payer: BC Managed Care – PPO | Admitting: Family Medicine

## 2020-05-09 ENCOUNTER — Other Ambulatory Visit: Payer: Self-pay

## 2020-05-09 ENCOUNTER — Encounter: Payer: Self-pay | Admitting: Family Medicine

## 2020-05-09 VITALS — BP 100/52 | HR 101 | Temp 97.6°F | Ht 62.0 in | Wt 177.0 lb

## 2020-05-09 DIAGNOSIS — E611 Iron deficiency: Secondary | ICD-10-CM

## 2020-05-09 DIAGNOSIS — C859A Non-Hodgkin lymphoma, unspecified, in remission: Secondary | ICD-10-CM

## 2020-05-09 DIAGNOSIS — I10 Essential (primary) hypertension: Secondary | ICD-10-CM | POA: Diagnosis not present

## 2020-05-09 DIAGNOSIS — D573 Sickle-cell trait: Secondary | ICD-10-CM | POA: Diagnosis not present

## 2020-05-09 DIAGNOSIS — C8590 Non-Hodgkin lymphoma, unspecified, unspecified site: Secondary | ICD-10-CM | POA: Diagnosis not present

## 2020-05-09 DIAGNOSIS — Z7189 Other specified counseling: Secondary | ICD-10-CM

## 2020-05-09 DIAGNOSIS — E039 Hypothyroidism, unspecified: Secondary | ICD-10-CM

## 2020-05-09 LAB — COMPREHENSIVE METABOLIC PANEL
ALT: 15 U/L (ref 0–35)
AST: 15 U/L (ref 0–37)
Albumin: 4.4 g/dL (ref 3.5–5.2)
Alkaline Phosphatase: 66 U/L (ref 39–117)
BUN: 8 mg/dL (ref 6–23)
CO2: 28 mEq/L (ref 19–32)
Calcium: 9.5 mg/dL (ref 8.4–10.5)
Chloride: 104 mEq/L (ref 96–112)
Creatinine, Ser: 0.69 mg/dL (ref 0.40–1.20)
GFR: 111.35 mL/min (ref >60.00)
Glucose, Bld: 100 mg/dL — ABNORMAL HIGH (ref 70–99)
Potassium: 4 mEq/L (ref 3.5–5.1)
Sodium: 139 mEq/L (ref 135–145)
Total Bilirubin: 0.5 mg/dL (ref 0.2–1.2)
Total Protein: 7.6 g/dL (ref 6.0–8.3)

## 2020-05-09 LAB — CBC WITH DIFFERENTIAL/PLATELET
Basophils Absolute: 0 10*3/uL (ref 0.0–0.1)
Basophils Relative: 0.6 % (ref 0.0–3.0)
Eosinophils Absolute: 0.1 10*3/uL (ref 0.0–0.7)
Eosinophils Relative: 2.5 % (ref 0.0–5.0)
HCT: 36.8 % (ref 36.0–46.0)
Hemoglobin: 11.8 g/dL — ABNORMAL LOW (ref 12.0–15.0)
Lymphocytes Relative: 36.3 % (ref 12.0–46.0)
Lymphs Abs: 1.8 10*3/uL (ref 0.7–4.0)
MCHC: 32.2 g/dL (ref 30.0–36.0)
MCV: 77.4 fl — ABNORMAL LOW (ref 78.0–100.0)
Monocytes Absolute: 0.5 10*3/uL (ref 0.1–1.0)
Monocytes Relative: 10.2 % (ref 3.0–12.0)
Neutro Abs: 2.5 10*3/uL (ref 1.4–7.7)
Neutrophils Relative %: 50.4 % (ref 43.0–77.0)
Platelets: 278 10*3/uL (ref 150.0–400.0)
RBC: 4.76 Mil/uL (ref 3.87–5.11)
RDW: 15.8 % — ABNORMAL HIGH (ref 11.5–15.5)
WBC: 4.9 10*3/uL (ref 4.0–10.5)

## 2020-05-09 LAB — LIPID PANEL
Cholesterol: 129 mg/dL (ref 0–200)
HDL: 51.1 mg/dL (ref >39.00)
LDL Cholesterol: 70 mg/dL (ref 0–99)
NonHDL: 77.52
Total CHOL/HDL Ratio: 3
Triglycerides: 40 mg/dL (ref 0.0–149.0)
VLDL: 8 mg/dL (ref 0.0–40.0)

## 2020-05-09 LAB — TSH: TSH: 2.19 u[IU]/mL (ref 0.35–4.50)

## 2020-05-09 LAB — IRON: Iron: 65 ug/dL (ref 42–145)

## 2020-05-09 NOTE — Patient Instructions (Signed)
Go to the lab on the way out.   If you have mychart we'll likely use that to update you.    Update me as needed.  Take care.  Glad to see you.

## 2020-05-09 NOTE — Progress Notes (Signed)
This visit occurred during the SARS-CoV-2 public health emergency.  Safety protocols were in place, including screening questions prior to the visit, additional usage of staff PPE, and extensive cleaning of exam room while observing appropriate contact time as indicated for disinfecting solutions.  Hypertension:    Using medication without problems or lightheadedness: yes Chest pain with exertion:no Edema:no Short of breath:no  Working as a Art gallery manager, from [place].    H/o sickle cell trait, no h/o crisis.  Discussed with patient.  History of anemia.  See notes on labs.  H/o lymphoma in remission.  No FCANVD. No LA noted by patient.    Hypothyroidism.  No neck mass, voice change, dysphagia.  We talked about the pathophysiology of hypothyroidism and replacement rationale.  Pap per gyn clinic.  I'll defer, d/w pt.    Advance directive d/w pt.  Mother designated if patient were incapacitated.    Meds, vitals, and allergies reviewed.   PMH and SH reviewed  ROS: Per HPI unless specifically indicated in ROS section   GEN: nad, alert and oriented HEENT: ncat NECK: supple w/o LA CV: rrr. PULM: ctab, no inc wob ABD: soft, +bs EXT: no edema SKIN: Well-perfused.  35 minutes were devoted to patient care in this encounter (this includes time spent reviewing the patient's file/history, interviewing and examining the patient, counseling/reviewing plan with patient).

## 2020-05-11 ENCOUNTER — Other Ambulatory Visit: Payer: Self-pay | Admitting: Family Medicine

## 2020-05-11 DIAGNOSIS — E039 Hypothyroidism, unspecified: Secondary | ICD-10-CM

## 2020-05-11 DIAGNOSIS — Z7189 Other specified counseling: Secondary | ICD-10-CM | POA: Insufficient documentation

## 2020-05-11 MED ORDER — LEVOTHYROXINE SODIUM 88 MCG PO TABS: 88.0000 ug | ORAL_TABLET | Freq: Every day | ORAL | 3 refills | Status: DC

## 2020-05-11 NOTE — Assessment & Plan Note (Signed)
History of.  No lymphadenopathy noted by patient.  She feels well.  Routine cautions given to patient.

## 2020-05-11 NOTE — Assessment & Plan Note (Signed)
Continue amlodipine.  See notes on labs.  Continue work on diet and exercise.

## 2020-05-11 NOTE — Assessment & Plan Note (Signed)
?

## 2020-05-11 NOTE — Assessment & Plan Note (Signed)
Recheck labs pending.  See notes on labs. 

## 2020-05-11 NOTE — Assessment & Plan Note (Signed)
History of, see notes on labs.

## 2020-05-11 NOTE — Assessment & Plan Note (Signed)
Routine cautions given to patient.  Discussed hydration.

## 2020-08-18 ENCOUNTER — Other Ambulatory Visit: Payer: Self-pay

## 2020-08-18 MED ORDER — AMLODIPINE BESYLATE 10 MG PO TABS: 1.0000 | ORAL_TABLET | Freq: Every day | ORAL | 1 refills | Status: DC

## 2020-09-23 ENCOUNTER — Encounter: Payer: Self-pay | Admitting: Family Medicine

## 2020-12-29 ENCOUNTER — Other Ambulatory Visit: Payer: Self-pay | Admitting: Obstetrics and Gynecology

## 2020-12-29 DIAGNOSIS — R928 Other abnormal and inconclusive findings on diagnostic imaging of breast: Secondary | ICD-10-CM

## 2021-01-14 ENCOUNTER — Other Ambulatory Visit: Payer: Self-pay

## 2021-01-14 ENCOUNTER — Ambulatory Visit
Admission: RE | Admit: 2021-01-14 | Discharge: 2021-01-14 | Disposition: A | Discharge status: Home / Self Care | Payer: BC Managed Care – PPO | Source: Ambulatory Visit | Attending: Obstetrics and Gynecology | Admitting: Obstetrics and Gynecology

## 2021-01-14 ENCOUNTER — Ambulatory Visit: Payer: BC Managed Care – PPO

## 2021-01-14 DIAGNOSIS — R928 Other abnormal and inconclusive findings on diagnostic imaging of breast: Secondary | ICD-10-CM

## 2021-05-18 ENCOUNTER — Other Ambulatory Visit: Payer: Self-pay | Admitting: Family Medicine

## 2021-05-18 DIAGNOSIS — E039 Hypothyroidism, unspecified: Secondary | ICD-10-CM

## 2021-05-18 NOTE — Telephone Encounter (Signed)
Refill request for amLODipine (NORVASC) 10 MG tablet ? ?LOV - 05/09/20 ?Next OV - 05/22/21 ?Last refill - 08/18/20 #90/1 ? ?

## 2021-05-22 ENCOUNTER — Ambulatory Visit: Payer: BC Managed Care – PPO | Admitting: Family Medicine

## 2021-05-22 ENCOUNTER — Encounter: Payer: Self-pay | Admitting: Family Medicine

## 2021-05-22 ENCOUNTER — Other Ambulatory Visit: Payer: Self-pay

## 2021-05-22 VITALS — BP 124/70 | HR 83 | Temp 97.0°F | Ht 62.0 in | Wt 181.0 lb

## 2021-05-22 DIAGNOSIS — I10 Essential (primary) hypertension: Secondary | ICD-10-CM | POA: Diagnosis not present

## 2021-05-22 DIAGNOSIS — E039 Hypothyroidism, unspecified: Secondary | ICD-10-CM | POA: Diagnosis not present

## 2021-05-22 DIAGNOSIS — L309 Dermatitis, unspecified: Secondary | ICD-10-CM | POA: Diagnosis not present

## 2021-05-22 DIAGNOSIS — Z7189 Other specified counseling: Secondary | ICD-10-CM

## 2021-05-22 DIAGNOSIS — Z309 Encounter for contraceptive management, unspecified: Secondary | ICD-10-CM

## 2021-05-22 DIAGNOSIS — D573 Sickle-cell trait: Secondary | ICD-10-CM

## 2021-05-22 LAB — CBC WITH DIFFERENTIAL/PLATELET
Basophils Absolute: 0 10*3/uL (ref 0.0–0.1)
Basophils Relative: 0.5 % (ref 0.0–3.0)
Eosinophils Absolute: 0.1 10*3/uL (ref 0.0–0.7)
Eosinophils Relative: 3.1 % (ref 0.0–5.0)
HCT: 36.5 % (ref 36.0–46.0)
Hemoglobin: 11.5 g/dL — ABNORMAL LOW (ref 12.0–15.0)
Lymphocytes Relative: 33.3 % (ref 12.0–46.0)
Lymphs Abs: 1.6 10*3/uL (ref 0.7–4.0)
MCHC: 31.5 g/dL (ref 30.0–36.0)
MCV: 74.8 fl — ABNORMAL LOW (ref 78.0–100.0)
Monocytes Absolute: 0.5 10*3/uL (ref 0.1–1.0)
Monocytes Relative: 10.4 % (ref 3.0–12.0)
Neutro Abs: 2.5 10*3/uL (ref 1.4–7.7)
Neutrophils Relative %: 52.7 % (ref 43.0–77.0)
Platelets: 291 10*3/uL (ref 150.0–400.0)
RBC: 4.88 Mil/uL (ref 3.87–5.11)
RDW: 16.7 % — ABNORMAL HIGH (ref 11.5–15.5)
WBC: 4.8 10*3/uL (ref 4.0–10.5)

## 2021-05-22 LAB — COMPREHENSIVE METABOLIC PANEL
ALT: 12 U/L (ref 0–35)
AST: 16 U/L (ref 0–37)
Albumin: 4.5 g/dL (ref 3.5–5.2)
Alkaline Phosphatase: 65 U/L (ref 39–117)
BUN: 11 mg/dL (ref 6–23)
CO2: 26 mEq/L (ref 19–32)
Calcium: 9.1 mg/dL (ref 8.4–10.5)
Chloride: 104 mEq/L (ref 96–112)
Creatinine, Ser: 0.67 mg/dL (ref 0.40–1.20)
GFR: 111.33 mL/min (ref >60.00)
Glucose, Bld: 100 mg/dL — ABNORMAL HIGH (ref 70–99)
Potassium: 4.2 mEq/L (ref 3.5–5.1)
Sodium: 138 mEq/L (ref 135–145)
Total Bilirubin: 0.5 mg/dL (ref 0.2–1.2)
Total Protein: 7.4 g/dL (ref 6.0–8.3)

## 2021-05-22 LAB — LIPID PANEL
Cholesterol: 126 mg/dL (ref 0–200)
HDL: 46.9 mg/dL (ref >39.00)
LDL Cholesterol: 69 mg/dL (ref 0–99)
NonHDL: 79.42
Total CHOL/HDL Ratio: 3
Triglycerides: 52 mg/dL (ref 0.0–149.0)
VLDL: 10.4 mg/dL (ref 0.0–40.0)

## 2021-05-22 LAB — IRON: Iron: 78 ug/dL (ref 42–145)

## 2021-05-22 LAB — TSH: TSH: 3.31 u[IU]/mL (ref 0.35–5.50)

## 2021-05-22 MED ORDER — TRIAMCINOLONE ACETONIDE 0.5 % EX CREA: 1.0000 "application " | TOPICAL_CREAM | Freq: Two times a day (BID) | CUTANEOUS | 2 refills | Status: DC | PRN

## 2021-05-22 NOTE — Patient Instructions (Addendum)
Go to the lab on the way out.   If you have mychart we'll likely use that to update you.    ?Take care.  Glad to see you. ?Update me as needed.   ?If the TAC 0.5% doesn't make a big difference soon, then let me know.   ?

## 2021-05-22 NOTE — Progress Notes (Signed)
This visit occurred during the SARS-CoV-2 public health emergency.  Safety protocols were in place, including screening questions prior to the visit, additional usage of staff PPE, and extensive cleaning of exam room while observing appropriate contact time as indicated for disinfecting solutions. ? ?Hypertension:    ?Using medication without problems or lightheadedness: yes ?Chest pain with exertion:no ?Edema:no ?Short of breath:no ?Labs pending.   ? ?Sickle cell trait.  No h/o crisis.  D/w pt about checking routine labs and staying hydrated.   ? ?Hypothyroidism.  No new neck mass.  Swallowing well.  TSH pending.  Compliant.  Fatigue noted.   ? ?D/w pt about contraception.  She didn't tolerate IUD.  Prev on depo shot but had swelling and sugar elevation.  She is going to f/u with GYN clinic.   ? ?Eczema on the back of the hand.  TAC didn't help much.  Itchy.  On the back of the leg and feet.  ? ?Advance directive d/w pt.  Mother designated if patient were incapacitated.   ? ?Meds, vitals, and allergies reviewed.  ? ?PMH and SH reviewed ? ?ROS: Per HPI unless specifically indicated in ROS section  ? ?GEN: nad, alert and oriented ?HEENT: ncat ?NECK: supple w/o LA, no TMG ?CV: rrr. ?PULM: ctab, no inc wob ?ABD: soft, +bs ?EXT: no edema ?SKIN: Well-perfused but dry eczema patches noted on the dorsum of the right hand. ?

## 2021-05-24 DIAGNOSIS — L309 Dermatitis, unspecified: Secondary | ICD-10-CM | POA: Insufficient documentation

## 2021-05-24 DIAGNOSIS — Z309 Encounter for contraceptive management, unspecified: Secondary | ICD-10-CM | POA: Insufficient documentation

## 2021-05-24 NOTE — Assessment & Plan Note (Signed)
Continue amlodipine.  Continue work on diet and exercise.  See notes on labs. 

## 2021-05-24 NOTE — Assessment & Plan Note (Signed)
Check labs.  Discussed staying hydrated.  She does not smoke.  No history of crisis. ?

## 2021-05-24 NOTE — Assessment & Plan Note (Signed)
Reasonable to try 0.5% triamcinolone cream as needed and then update me if that is not helping.  Routine steroid cautions discussed with patient. ?

## 2021-05-24 NOTE — Assessment & Plan Note (Signed)
?  Advance directive d/w pt.  Mother designated if patient were incapacitated.   ?

## 2021-05-24 NOTE — Assessment & Plan Note (Signed)
Fatigue noted.  Swallowing well.  See notes on TSH.  Continue levothyroxine as is for now. ?

## 2021-05-24 NOTE — Assessment & Plan Note (Signed)
?  D/w pt about contraception.  She didn't tolerate IUD.  Prev on depo shot but had swelling and sugar elevation.  She is going to f/u with GYN clinic.  I will await the oncology input. ?

## 2022-03-18 ENCOUNTER — Telehealth: Payer: Self-pay | Admitting: Family Medicine

## 2022-03-18 NOTE — Telephone Encounter (Signed)
Pt called in want to know if she can get a proscription for emergency contraceptive stated her OBGYN is out of there office for the week was told to call her PCP . Please advise # [phone]

## 2022-03-18 NOTE — Telephone Encounter (Signed)
LMTCB

## 2022-03-19 NOTE — Telephone Encounter (Signed)
Spoke with patient and she was able to get what she needed yesterday. Nothing further needed from [place].

## 2022-05-29 ENCOUNTER — Other Ambulatory Visit: Payer: Self-pay | Admitting: Family Medicine

## 2022-05-29 DIAGNOSIS — E039 Hypothyroidism, unspecified: Secondary | ICD-10-CM

## 2022-05-31 NOTE — Telephone Encounter (Signed)
Scheduled for CPE 3/21, scheduled  cpe labs tues 3/19.

## 2022-05-31 NOTE — Telephone Encounter (Signed)
Please call patient needs CPE appointment with labs a few days before. Let me know once done and will send for refills.

## 2022-06-01 ENCOUNTER — Other Ambulatory Visit: Payer: Self-pay | Admitting: Family Medicine

## 2022-06-01 ENCOUNTER — Other Ambulatory Visit (INDEPENDENT_AMBULATORY_CARE_PROVIDER_SITE_OTHER): Payer: BC Managed Care – PPO

## 2022-06-01 DIAGNOSIS — D649 Anemia, unspecified: Secondary | ICD-10-CM | POA: Diagnosis not present

## 2022-06-01 DIAGNOSIS — E039 Hypothyroidism, unspecified: Secondary | ICD-10-CM

## 2022-06-01 DIAGNOSIS — I10 Essential (primary) hypertension: Secondary | ICD-10-CM

## 2022-06-01 DIAGNOSIS — D573 Sickle-cell trait: Secondary | ICD-10-CM | POA: Diagnosis not present

## 2022-06-01 LAB — CBC WITH DIFFERENTIAL/PLATELET
Basophils Absolute: 0 10*3/uL (ref 0.0–0.1)
Basophils Relative: 0.2 % (ref 0.0–3.0)
Eosinophils Absolute: 0.2 10*3/uL (ref 0.0–0.7)
Eosinophils Relative: 4.7 % (ref 0.0–5.0)
HCT: 33 % — ABNORMAL LOW (ref 36.0–46.0)
Hemoglobin: 10.5 g/dL — ABNORMAL LOW (ref 12.0–15.0)
Lymphocytes Relative: 37.3 % (ref 12.0–46.0)
Lymphs Abs: 1.8 10*3/uL (ref 0.7–4.0)
MCHC: 31.7 g/dL (ref 30.0–36.0)
MCV: 74.4 fl — ABNORMAL LOW (ref 78.0–100.0)
Monocytes Absolute: 0.5 10*3/uL (ref 0.1–1.0)
Monocytes Relative: 10.1 % (ref 3.0–12.0)
Neutro Abs: 2.3 10*3/uL (ref 1.4–7.7)
Neutrophils Relative %: 47.7 % (ref 43.0–77.0)
Platelets: 331 10*3/uL (ref 150.0–400.0)
RBC: 4.44 Mil/uL (ref 3.87–5.11)
RDW: 16.6 % — ABNORMAL HIGH (ref 11.5–15.5)
WBC: 4.8 10*3/uL (ref 4.0–10.5)

## 2022-06-01 LAB — FERRITIN: Ferritin: 2.6 ng/mL — ABNORMAL LOW (ref 10.0–291.0)

## 2022-06-01 LAB — LIPID PANEL
Cholesterol: 111 mg/dL (ref 0–200)
HDL: 41.5 mg/dL (ref >39.00)
LDL Cholesterol: 61 mg/dL (ref 0–99)
NonHDL: 69.42
Total CHOL/HDL Ratio: 3
Triglycerides: 42 mg/dL (ref 0.0–149.0)
VLDL: 8.4 mg/dL (ref 0.0–40.0)

## 2022-06-01 LAB — COMPREHENSIVE METABOLIC PANEL
ALT: 15 U/L (ref 0–35)
AST: 15 U/L (ref 0–37)
Albumin: 3.9 g/dL (ref 3.5–5.2)
Alkaline Phosphatase: 54 U/L (ref 39–117)
BUN: 8 mg/dL (ref 6–23)
CO2: 26 mEq/L (ref 19–32)
Calcium: 8.8 mg/dL (ref 8.4–10.5)
Chloride: 107 mEq/L (ref 96–112)
Creatinine, Ser: 0.7 mg/dL (ref 0.40–1.20)
GFR: 109.37 mL/min (ref >60.00)
Glucose, Bld: 86 mg/dL (ref 70–99)
Potassium: 3.8 mEq/L (ref 3.5–5.1)
Sodium: 141 mEq/L (ref 135–145)
Total Bilirubin: 0.4 mg/dL (ref 0.2–1.2)
Total Protein: 6.9 g/dL (ref 6.0–8.3)

## 2022-06-01 LAB — TSH: TSH: 3.27 u[IU]/mL (ref 0.35–5.50)

## 2022-06-01 LAB — IRON: Iron: 28 ug/dL — ABNORMAL LOW (ref 42–145)

## 2022-06-01 LAB — VITAMIN B12: Vitamin B-12: 130 pg/mL — ABNORMAL LOW (ref 211–911)

## 2022-06-03 ENCOUNTER — Encounter: Payer: Self-pay | Admitting: Family Medicine

## 2022-06-03 ENCOUNTER — Ambulatory Visit (INDEPENDENT_AMBULATORY_CARE_PROVIDER_SITE_OTHER): Payer: BC Managed Care – PPO | Admitting: Family Medicine

## 2022-06-03 VITALS — BP 118/78 | HR 104 | Temp 98.3°F | Ht 62.0 in | Wt 168.0 lb

## 2022-06-03 DIAGNOSIS — E611 Iron deficiency: Secondary | ICD-10-CM

## 2022-06-03 DIAGNOSIS — L309 Dermatitis, unspecified: Secondary | ICD-10-CM

## 2022-06-03 DIAGNOSIS — Z0000 Encounter for general adult medical examination without abnormal findings: Secondary | ICD-10-CM | POA: Diagnosis not present

## 2022-06-03 DIAGNOSIS — I10 Essential (primary) hypertension: Secondary | ICD-10-CM

## 2022-06-03 DIAGNOSIS — Z7189 Other specified counseling: Secondary | ICD-10-CM

## 2022-06-03 DIAGNOSIS — D649 Anemia, unspecified: Secondary | ICD-10-CM

## 2022-06-03 DIAGNOSIS — E538 Deficiency of other specified B group vitamins: Secondary | ICD-10-CM | POA: Diagnosis not present

## 2022-06-03 DIAGNOSIS — E039 Hypothyroidism, unspecified: Secondary | ICD-10-CM

## 2022-06-03 DIAGNOSIS — R011 Cardiac murmur, unspecified: Secondary | ICD-10-CM

## 2022-06-03 MED ORDER — TRIAMCINOLONE ACETONIDE 0.5 % EX CREA: 1.0000 | TOPICAL_CREAM | Freq: Two times a day (BID) | CUTANEOUS | 2 refills | Status: DC | PRN

## 2022-06-03 MED ORDER — IRON (FERROUS SULFATE) 325 (65 FE) MG PO TABS: 325.0000 mg | ORAL_TABLET | Freq: Every evening | ORAL | 3 refills | Status: DC

## 2022-06-03 MED ORDER — CYANOCOBALAMIN 1000 MCG/ML IJ SOLN: INTRAMUSCULAR | 0 refills | Status: DC

## 2022-06-03 MED ORDER — CYANOCOBALAMIN 1000 MCG/ML IJ SOLN
1000.0000 ug | Freq: Once | INTRAMUSCULAR | Status: AC
  Administered 2022-06-03: 1000 ug via INTRAMUSCULAR

## 2022-06-03 NOTE — Progress Notes (Signed)
CPE- See plan.  Routine anticipatory guidance given to patient.  See health maintenance.  The possibility exists that previously documented standard health maintenance information may have been brought forward from a previous encounter into this note.  If needed, that same information has been updated to reflect the current situation based on today's encounter.    Tdap 2013 Flu prev done Covid prev done.   PNA prev done . Shingles not due.  RSV not due.  Pap per gyn.   DXA not due.  Mammogram per gynecology.   Colon cancer screening not due.   Advance directive d/w pt.  Mother designated if patient were incapacitated.    Diet and exercise d/w pt.    H/o sickle cell trait.  Iron/B12/HGB low.  Discussed options for replacement and recheck.  Using TAC for eczema.    SEM noted. D/w pt about prev echo.  Recheck echo ordered.  Hypertension:    Using medication without problems or lightheadedness: yes Chest pain with exertion:no Edema:no Short of breath:no Labs d/w pt.   Hypothyroidism.  TSH wnl.  No neck mass. No dysphagia.    PMH and SH reviewed  Meds, vitals, and allergies reviewed.   ROS: Per HPI.  Unless specifically indicated otherwise in HPI, the patient denies:  General: fever. Eyes: acute vision changes ENT: sore throat Cardiovascular: chest pain Respiratory: SOB GI: vomiting GU: dysuria Musculoskeletal: acute back pain Derm: acute rash Neuro: acute motor dysfunction Psych: worsening mood Endocrine: polydipsia Heme: bleeding Allergy: hayfever  GEN: nad, alert and oriented HEENT:  NECK: supple w/o LA CV: rrr. SEM noted  PULM: ctab, no inc wob ABD: soft, +bs EXT: no edema SKIN: well perfused.

## 2022-06-03 NOTE — Patient Instructions (Signed)
Let me know if you don't get a call about the echo.  B12 shot today, weekly x4, then monthly.  Iron at night. Stool softener if needed.  Recheck lab in about 3 months (before B12 shot that day). Take care.  Glad to see you.

## 2022-06-04 ENCOUNTER — Encounter (HOSPITAL_COMMUNITY): Payer: Self-pay

## 2022-06-04 DIAGNOSIS — R011 Cardiac murmur, unspecified: Secondary | ICD-10-CM | POA: Insufficient documentation

## 2022-06-04 DIAGNOSIS — E538 Deficiency of other specified B group vitamins: Secondary | ICD-10-CM | POA: Insufficient documentation

## 2022-06-04 DIAGNOSIS — Z0000 Encounter for general adult medical examination without abnormal findings: Secondary | ICD-10-CM | POA: Insufficient documentation

## 2022-06-04 NOTE — Assessment & Plan Note (Signed)
Tdap 2013 Flu prev done Covid prev done.   PNA prev done . Shingles not due.  RSV not due.  Pap per gyn.   DXA not due.  Mammogram per gynecology.   Colon cancer screening not due.   Advance directive d/w pt.  Mother designated if patient were incapacitated.    Diet and exercise d/w pt.

## 2022-06-04 NOTE — Assessment & Plan Note (Signed)
Reasonable to update echo given murmur.  Ordered.

## 2022-06-04 NOTE — Assessment & Plan Note (Signed)
Discussed options.  She may not be able to get adequate replacement with oral B12.  Start 1000 mcg B12 IM weekly x 4 doses then monthly thereafter then recheck labs.

## 2022-06-04 NOTE — Assessment & Plan Note (Signed)
Continue amlodipine as is.  Labs discussed with patient.

## 2022-06-04 NOTE — Assessment & Plan Note (Signed)
Continue as needed triamcinolone.

## 2022-06-04 NOTE — Assessment & Plan Note (Signed)
Advance directive d/w pt. Mother designated if patient were incapacitated.   

## 2022-06-04 NOTE — Assessment & Plan Note (Signed)
Restart iron and recheck labs in about 3 months.  Routine cautions given to patient.

## 2022-06-04 NOTE — Assessment & Plan Note (Signed)
TSH normal.  Continue levothyroxine as is. 

## 2022-06-09 ENCOUNTER — Ambulatory Visit (INDEPENDENT_AMBULATORY_CARE_PROVIDER_SITE_OTHER): Payer: BC Managed Care – PPO

## 2022-06-09 DIAGNOSIS — E538 Deficiency of other specified B group vitamins: Secondary | ICD-10-CM | POA: Diagnosis not present

## 2022-06-09 MED ORDER — CYANOCOBALAMIN 1000 MCG/ML IJ SOLN
1000.0000 ug | Freq: Once | INTRAMUSCULAR | Status: AC
  Administered 2022-06-09: 1000 ug via INTRAMUSCULAR

## 2022-06-09 NOTE — Progress Notes (Signed)
Per orders of Dr. Elsie Stain out of office and Dr Ria Bush in office, injection of Vitamin B 12 given in right deltoid given by Ozzie Hoyle. Patient tolerated injection well.

## 2022-06-16 ENCOUNTER — Ambulatory Visit (INDEPENDENT_AMBULATORY_CARE_PROVIDER_SITE_OTHER): Payer: BC Managed Care – PPO

## 2022-06-16 DIAGNOSIS — E538 Deficiency of other specified B group vitamins: Secondary | ICD-10-CM

## 2022-06-16 MED ORDER — CYANOCOBALAMIN 1000 MCG/ML IJ SOLN
1000.0000 ug | Freq: Once | INTRAMUSCULAR | Status: AC
  Administered 2022-06-16: 1000 ug via INTRAMUSCULAR

## 2022-06-16 NOTE — Progress Notes (Signed)
After obtaining consent, and per orders of Dr. Danise Mina, monthly injection of B-12 given by Ferne Reus IM in her left deltoid. Patient tolerated injection well.

## 2022-06-22 ENCOUNTER — Telehealth: Payer: Self-pay | Admitting: Family Medicine

## 2022-06-22 NOTE — Telephone Encounter (Signed)
Should be okay to proceed with the procedure.  Please let patient and surgery team know. Thanks.

## 2022-06-22 NOTE — Telephone Encounter (Signed)
Surgery clearance form has been faxed back

## 2022-06-22 NOTE — Telephone Encounter (Signed)
Patient called our office stating she is scheduled to have tubal ligation surgery tomorrow, also said the surgery has been scheduled for 4/10 since January of this year. Due to the echo that was ordered by Dr. Para March, the surgery center would like to push her surgery until after she has the echo. Surgery center told the patient that if she could get clearance from Dr. Para March they could proceed with her surgery tomorrow. Patient would rather have her surgery done tomorrow rather than wait until after the echo, patient is requesting to be cleared for her surgery by Dr. Para March. Patient states this is urgent, please advise [phone].

## 2022-06-23 ENCOUNTER — Other Ambulatory Visit: Payer: Self-pay | Admitting: Obstetrics and Gynecology

## 2022-06-24 ENCOUNTER — Ambulatory Visit (INDEPENDENT_AMBULATORY_CARE_PROVIDER_SITE_OTHER): Payer: BC Managed Care – PPO

## 2022-06-24 DIAGNOSIS — E538 Deficiency of other specified B group vitamins: Secondary | ICD-10-CM

## 2022-06-24 MED ORDER — CYANOCOBALAMIN 1000 MCG/ML IJ SOLN
1000.0000 ug | Freq: Once | INTRAMUSCULAR | Status: AC
  Administered 2022-06-24: 1000 ug via INTRAMUSCULAR

## 2022-06-24 NOTE — Progress Notes (Signed)
Per orders of Dr. Graham Duncan, injection of Vitamin B 12 given in right deltoid given by Lawerence Dery. Patient tolerated injection well.  

## 2022-06-30 ENCOUNTER — Ambulatory Visit (HOSPITAL_COMMUNITY): Payer: BC Managed Care – PPO | Attending: Cardiology

## 2022-06-30 DIAGNOSIS — R011 Cardiac murmur, unspecified: Secondary | ICD-10-CM | POA: Diagnosis not present

## 2022-06-30 LAB — ECHOCARDIOGRAM COMPLETE
Area-P 1/2: 4.6 cm2
S' Lateral: 2.7 cm

## 2022-07-01 ENCOUNTER — Ambulatory Visit: Payer: BC Managed Care – PPO

## 2022-07-08 ENCOUNTER — Ambulatory Visit: Payer: BC Managed Care – PPO

## 2022-07-27 ENCOUNTER — Other Ambulatory Visit: Payer: Self-pay | Admitting: Family Medicine

## 2022-07-27 ENCOUNTER — Ambulatory Visit (INDEPENDENT_AMBULATORY_CARE_PROVIDER_SITE_OTHER): Payer: BC Managed Care – PPO

## 2022-07-27 DIAGNOSIS — E538 Deficiency of other specified B group vitamins: Secondary | ICD-10-CM | POA: Diagnosis not present

## 2022-07-27 MED ORDER — CYANOCOBALAMIN 1000 MCG/ML IJ SOLN: INTRAMUSCULAR | 0 refills | Status: AC

## 2022-07-27 MED ORDER — CYANOCOBALAMIN 1000 MCG/ML IJ SOLN
1000.0000 ug | Freq: Once | INTRAMUSCULAR | Status: AC
Start: 2022-07-27 — End: 2022-07-27
  Administered 2022-07-27: 1000 ug via INTRAMUSCULAR

## 2022-07-27 NOTE — Progress Notes (Signed)
Per orders of Dr. Crawford Givens, injection of b 12 given by Donnamarie Poag in right deltoid. Patient tolerated injection well. Patient will make appointment for 1 month.

## 2022-08-03 ENCOUNTER — Encounter: Payer: Self-pay | Admitting: Family Medicine

## 2022-08-26 ENCOUNTER — Other Ambulatory Visit (INDEPENDENT_AMBULATORY_CARE_PROVIDER_SITE_OTHER): Payer: BC Managed Care – PPO

## 2022-08-26 ENCOUNTER — Ambulatory Visit: Payer: BC Managed Care – PPO

## 2022-08-26 DIAGNOSIS — D649 Anemia, unspecified: Secondary | ICD-10-CM | POA: Diagnosis not present

## 2022-08-26 DIAGNOSIS — E538 Deficiency of other specified B group vitamins: Secondary | ICD-10-CM

## 2022-08-26 LAB — CBC WITH DIFFERENTIAL/PLATELET
Basophils Absolute: 0 10*3/uL (ref 0.0–0.1)
Basophils Relative: 0.6 % (ref 0.0–3.0)
Eosinophils Absolute: 0.2 10*3/uL (ref 0.0–0.7)
Eosinophils Relative: 4.5 % (ref 0.0–5.0)
HCT: 37.4 % (ref 36.0–46.0)
Hemoglobin: 11.5 g/dL — ABNORMAL LOW (ref 12.0–15.0)
Lymphocytes Relative: 43.1 % (ref 12.0–46.0)
Lymphs Abs: 2.1 10*3/uL (ref 0.7–4.0)
MCHC: 30.7 g/dL (ref 30.0–36.0)
MCV: 75.3 fl — ABNORMAL LOW (ref 78.0–100.0)
Monocytes Absolute: 0.5 10*3/uL (ref 0.1–1.0)
Monocytes Relative: 10 % (ref 3.0–12.0)
Neutro Abs: 2 10*3/uL (ref 1.4–7.7)
Neutrophils Relative %: 41.8 % — ABNORMAL LOW (ref 43.0–77.0)
Platelets: 332 10*3/uL (ref 150.0–400.0)
RBC: 4.96 Mil/uL (ref 3.87–5.11)
RDW: 19.8 % — ABNORMAL HIGH (ref 11.5–15.5)
WBC: 4.8 10*3/uL (ref 4.0–10.5)

## 2022-08-26 LAB — VITAMIN B12: Vitamin B-12: 397 pg/mL (ref 211–911)

## 2022-08-26 LAB — IRON: Iron: 49 ug/dL (ref 42–145)

## 2022-08-26 LAB — FERRITIN: Ferritin: 10.5 ng/mL (ref 10.0–291.0)

## 2022-08-29 ENCOUNTER — Other Ambulatory Visit: Payer: Self-pay | Admitting: Family Medicine

## 2022-08-29 DIAGNOSIS — E538 Deficiency of other specified B group vitamins: Secondary | ICD-10-CM

## 2022-08-29 DIAGNOSIS — D649 Anemia, unspecified: Secondary | ICD-10-CM

## 2022-08-31 ENCOUNTER — Ambulatory Visit (INDEPENDENT_AMBULATORY_CARE_PROVIDER_SITE_OTHER): Payer: BC Managed Care – PPO

## 2022-08-31 DIAGNOSIS — E538 Deficiency of other specified B group vitamins: Secondary | ICD-10-CM | POA: Diagnosis not present

## 2022-08-31 MED ORDER — CYANOCOBALAMIN 1000 MCG/ML IJ SOLN
1000.0000 ug | Freq: Once | INTRAMUSCULAR | Status: AC
Start: 2022-08-31 — End: 2022-08-31
  Administered 2022-08-31: 1000 ug via INTRAMUSCULAR

## 2022-08-31 NOTE — Progress Notes (Signed)
Per orders of Dr. Graham Duncan, injection of vitamin b 12 given by Terre Zabriskie in left deltoid. Patient tolerated injection well. Patient will make appointment for 1 month.   

## 2022-10-05 ENCOUNTER — Ambulatory Visit (INDEPENDENT_AMBULATORY_CARE_PROVIDER_SITE_OTHER): Payer: BC Managed Care – PPO

## 2022-10-05 DIAGNOSIS — E538 Deficiency of other specified B group vitamins: Secondary | ICD-10-CM

## 2022-10-05 MED ORDER — CYANOCOBALAMIN 1000 MCG/ML IJ SOLN
1000.0000 ug | Freq: Once | INTRAMUSCULAR | Status: AC
Start: 2022-10-05 — End: 2022-10-05
  Administered 2022-10-05: 1000 ug via INTRAMUSCULAR

## 2022-10-05 NOTE — Progress Notes (Signed)
Per orders of Dr. Crawford Givens, injection of vitamin B 12 given by Lewanda Rife in right deltoid. Patient tolerated injection well. Patient will make appointment for 1 month.

## 2022-11-10 ENCOUNTER — Ambulatory Visit (INDEPENDENT_AMBULATORY_CARE_PROVIDER_SITE_OTHER): Payer: BC Managed Care – PPO

## 2022-11-10 DIAGNOSIS — E538 Deficiency of other specified B group vitamins: Secondary | ICD-10-CM

## 2022-11-10 MED ORDER — CYANOCOBALAMIN 1000 MCG/ML IJ SOLN
1000.0000 ug | Freq: Once | INTRAMUSCULAR | Status: AC
Start: 2022-11-10 — End: 2022-11-10
  Administered 2022-11-10: 1000 ug via INTRAMUSCULAR

## 2022-11-10 NOTE — Progress Notes (Signed)
Per orders of Dr. Loleta Books mis out of office and Dr Eustaquio Boyden who is in office injection of vitamin b 12 given by Lewanda Rife in left deltoid. Patient tolerated injection well. Patient will make appointment for 1 month.

## 2022-12-14 ENCOUNTER — Ambulatory Visit (INDEPENDENT_AMBULATORY_CARE_PROVIDER_SITE_OTHER): Payer: BC Managed Care – PPO

## 2022-12-14 DIAGNOSIS — E538 Deficiency of other specified B group vitamins: Secondary | ICD-10-CM

## 2022-12-14 MED ORDER — CYANOCOBALAMIN 1000 MCG/ML IJ SOLN
1000.0000 ug | Freq: Once | INTRAMUSCULAR | Status: AC
Start: 2022-12-14 — End: 2022-12-14
  Administered 2022-12-14: 1000 ug via INTRAMUSCULAR

## 2022-12-14 NOTE — Progress Notes (Signed)
Per orders of Dr. Graham Duncan, injection of vitamin b 12 given by Jazmon Kos in right deltoid. Patient tolerated injection well. Patient will make appointment for 1 month.   

## 2023-01-13 LAB — HM MAMMOGRAPHY

## 2023-01-18 ENCOUNTER — Ambulatory Visit (INDEPENDENT_AMBULATORY_CARE_PROVIDER_SITE_OTHER): Payer: BC Managed Care – PPO

## 2023-01-18 ENCOUNTER — Other Ambulatory Visit (INDEPENDENT_AMBULATORY_CARE_PROVIDER_SITE_OTHER): Payer: BC Managed Care – PPO

## 2023-01-18 DIAGNOSIS — D649 Anemia, unspecified: Secondary | ICD-10-CM

## 2023-01-18 DIAGNOSIS — E538 Deficiency of other specified B group vitamins: Secondary | ICD-10-CM

## 2023-01-18 DIAGNOSIS — Z23 Encounter for immunization: Secondary | ICD-10-CM | POA: Diagnosis not present

## 2023-01-18 LAB — CBC WITH DIFFERENTIAL/PLATELET
Basophils Absolute: 0 10*3/uL (ref 0.0–0.1)
Basophils Relative: 0.5 % (ref 0.0–3.0)
Eosinophils Absolute: 0.1 10*3/uL (ref 0.0–0.7)
Eosinophils Relative: 3.1 % (ref 0.0–5.0)
HCT: 40.6 % (ref 36.0–46.0)
Hemoglobin: 12.9 g/dL (ref 12.0–15.0)
Lymphocytes Relative: 41.3 % (ref 12.0–46.0)
Lymphs Abs: 1.8 10*3/uL (ref 0.7–4.0)
MCHC: 31.8 g/dL (ref 30.0–36.0)
MCV: 82.3 fL (ref 78.0–100.0)
Monocytes Absolute: 0.3 10*3/uL (ref 0.1–1.0)
Monocytes Relative: 7.1 % (ref 3.0–12.0)
Neutro Abs: 2.1 10*3/uL (ref 1.4–7.7)
Neutrophils Relative %: 48 % (ref 43.0–77.0)
Platelets: 273 10*3/uL (ref 150.0–400.0)
RBC: 4.93 Mil/uL (ref 3.87–5.11)
RDW: 14.6 % (ref 11.5–15.5)
WBC: 4.4 10*3/uL (ref 4.0–10.5)

## 2023-01-18 LAB — IRON: Iron: 79 ug/dL (ref 42–145)

## 2023-01-18 LAB — VITAMIN B12: Vitamin B-12: 393 pg/mL (ref 211–911)

## 2023-01-18 LAB — FERRITIN: Ferritin: 20.7 ng/mL (ref 10.0–291.0)

## 2023-01-18 MED ORDER — CYANOCOBALAMIN 1000 MCG/ML IJ SOLN
1000.0000 ug | Freq: Once | INTRAMUSCULAR | Status: AC
Start: 2023-01-18 — End: 2023-01-18
  Administered 2023-01-18: 1000 ug via INTRAMUSCULAR

## 2023-01-18 NOTE — Progress Notes (Signed)
Patient presented for B 12 injection given by Rhylan Kagel, CMA to left deltoid, patient voiced no concerns nor showed any signs of distress during injection.  

## 2023-02-17 ENCOUNTER — Ambulatory Visit: Payer: BC Managed Care – PPO | Admitting: *Deleted

## 2023-02-17 DIAGNOSIS — E538 Deficiency of other specified B group vitamins: Secondary | ICD-10-CM

## 2023-02-17 MED ORDER — CYANOCOBALAMIN 1000 MCG/ML IJ SOLN
1000.0000 ug | Freq: Once | INTRAMUSCULAR | Status: AC
Start: 2023-02-17 — End: 2023-02-17
  Administered 2023-02-17: 1000 ug via INTRAMUSCULAR

## 2023-02-17 NOTE — Progress Notes (Signed)
Per orders of Dr. Crawford Givens, injection of B-12 given by Blenda Mounts M in right deltoid. Patient tolerated injection well.

## 2023-03-22 ENCOUNTER — Ambulatory Visit: Payer: 59

## 2023-03-22 DIAGNOSIS — E538 Deficiency of other specified B group vitamins: Secondary | ICD-10-CM | POA: Diagnosis not present

## 2023-03-22 MED ORDER — CYANOCOBALAMIN 1000 MCG/ML IJ SOLN
1000.0000 ug | Freq: Once | INTRAMUSCULAR | Status: AC
Start: 2023-03-22 — End: 2023-03-22
  Administered 2023-03-22: 1000 ug via INTRAMUSCULAR

## 2023-03-22 NOTE — Progress Notes (Signed)
 Per orders of Dr. Crawford Givens, injection of vitamin b 12 given by Lewanda Rife in left deltoid. Patient tolerated injection well. Patient will make appointment for 1 month.

## 2023-04-26 ENCOUNTER — Ambulatory Visit: Payer: 59

## 2023-04-27 ENCOUNTER — Ambulatory Visit (INDEPENDENT_AMBULATORY_CARE_PROVIDER_SITE_OTHER): Payer: 59

## 2023-04-27 DIAGNOSIS — E538 Deficiency of other specified B group vitamins: Secondary | ICD-10-CM | POA: Diagnosis not present

## 2023-04-27 MED ORDER — CYANOCOBALAMIN 1000 MCG/ML IJ SOLN
1000.0000 ug | Freq: Once | INTRAMUSCULAR | Status: AC
  Administered 2023-04-27: 1000 ug via INTRAMUSCULAR

## 2023-04-27 NOTE — Progress Notes (Signed)
Per orders of Dr. Crawford Givens who is out of office and Dr Eustaquio Boyden who is in office, injection of vitamin b 12  given by Lewanda Rife in right deltoid. Patient tolerated injection well. Patient will make appointment for 1 month.

## 2023-05-25 ENCOUNTER — Ambulatory Visit (INDEPENDENT_AMBULATORY_CARE_PROVIDER_SITE_OTHER): Payer: 59

## 2023-05-25 DIAGNOSIS — E538 Deficiency of other specified B group vitamins: Secondary | ICD-10-CM

## 2023-05-25 MED ORDER — CYANOCOBALAMIN 1000 MCG/ML IJ SOLN
1000.0000 ug | Freq: Once | INTRAMUSCULAR | Status: AC
  Administered 2023-05-25: 1000 ug via INTRAMUSCULAR

## 2023-05-25 NOTE — Progress Notes (Signed)
 Per orders of Dr. Loleta Books is out of office and Dr Eustaquio Boyden who is in office injection of vitamin b 12 given by Lewanda Rife in left deltoid.Patient tolerated injection well. Patient will make appointment for 1 month.

## 2023-06-29 ENCOUNTER — Ambulatory Visit (INDEPENDENT_AMBULATORY_CARE_PROVIDER_SITE_OTHER)

## 2023-06-29 DIAGNOSIS — E538 Deficiency of other specified B group vitamins: Secondary | ICD-10-CM | POA: Diagnosis not present

## 2023-06-29 MED ORDER — CYANOCOBALAMIN 1000 MCG/ML IJ SOLN
1000.0000 ug | Freq: Once | INTRAMUSCULAR | Status: AC
Start: 2023-06-29 — End: 2023-06-29
  Administered 2023-06-29: 1000 ug via INTRAMUSCULAR

## 2023-06-29 NOTE — Progress Notes (Signed)
Per orders of Dr. Crawford Givens, who is out of office and Dr Eustaquio Boyden who is in office; injection of vitamin b 12 given by Lewanda Rife in right deltoid. Patient tolerated injection well. Patient will make appointment for 1 month.

## 2023-07-04 ENCOUNTER — Encounter: Payer: Self-pay | Admitting: Family Medicine

## 2023-07-06 ENCOUNTER — Other Ambulatory Visit: Payer: Self-pay | Admitting: Family Medicine

## 2023-07-06 MED ORDER — TRIAMCINOLONE ACETONIDE 0.5 % EX CREA: 1.0000 | TOPICAL_CREAM | Freq: Two times a day (BID) | CUTANEOUS | 0 refills | Status: AC | PRN

## 2023-07-26 LAB — HM PAP SMEAR

## 2023-08-02 ENCOUNTER — Ambulatory Visit (INDEPENDENT_AMBULATORY_CARE_PROVIDER_SITE_OTHER)

## 2023-08-02 DIAGNOSIS — E538 Deficiency of other specified B group vitamins: Secondary | ICD-10-CM

## 2023-08-02 MED ORDER — CYANOCOBALAMIN 1000 MCG/ML IJ SOLN
1000.0000 ug | Freq: Once | INTRAMUSCULAR | Status: AC
  Administered 2023-08-02: 1000 ug via INTRAMUSCULAR

## 2023-08-02 NOTE — Progress Notes (Signed)
 Per orders of Dr. Crawford Givens, injection of vitamin b 12 given by Lewanda Rife in left deltoid. Patient tolerated injection well. Patient will make appointment for 1 month.

## 2023-08-07 ENCOUNTER — Other Ambulatory Visit: Payer: Self-pay | Admitting: Family Medicine

## 2023-08-09 ENCOUNTER — Other Ambulatory Visit: Payer: Self-pay | Admitting: Family Medicine

## 2023-08-09 DIAGNOSIS — E039 Hypothyroidism, unspecified: Secondary | ICD-10-CM

## 2023-09-07 ENCOUNTER — Ambulatory Visit

## 2023-09-09 ENCOUNTER — Encounter: Payer: Self-pay | Admitting: Family Medicine

## 2023-09-09 ENCOUNTER — Ambulatory Visit (INDEPENDENT_AMBULATORY_CARE_PROVIDER_SITE_OTHER): Admitting: Family Medicine

## 2023-09-09 VITALS — BP 100/60 | HR 89 | Temp 98.9°F | Resp 20 | Ht 62.0 in | Wt 141.4 lb

## 2023-09-09 DIAGNOSIS — I10 Essential (primary) hypertension: Secondary | ICD-10-CM

## 2023-09-09 DIAGNOSIS — E039 Hypothyroidism, unspecified: Secondary | ICD-10-CM

## 2023-09-09 DIAGNOSIS — D649 Anemia, unspecified: Secondary | ICD-10-CM | POA: Diagnosis not present

## 2023-09-09 DIAGNOSIS — Z23 Encounter for immunization: Secondary | ICD-10-CM | POA: Diagnosis not present

## 2023-09-09 DIAGNOSIS — D573 Sickle-cell trait: Secondary | ICD-10-CM

## 2023-09-09 DIAGNOSIS — Z7189 Other specified counseling: Secondary | ICD-10-CM

## 2023-09-09 DIAGNOSIS — E538 Deficiency of other specified B group vitamins: Secondary | ICD-10-CM

## 2023-09-09 DIAGNOSIS — Z0000 Encounter for general adult medical examination without abnormal findings: Secondary | ICD-10-CM | POA: Diagnosis not present

## 2023-09-09 DIAGNOSIS — E611 Iron deficiency: Secondary | ICD-10-CM

## 2023-09-09 LAB — LIPID PANEL
Cholesterol: 127 mg/dL (ref 0–200)
HDL: 51.6 mg/dL (ref >39.00)
LDL Cholesterol: 63 mg/dL (ref 0–99)
NonHDL: 75.03
Total CHOL/HDL Ratio: 2
Triglycerides: 61 mg/dL (ref 0.0–149.0)
VLDL: 12.2 mg/dL (ref 0.0–40.0)

## 2023-09-09 LAB — COMPREHENSIVE METABOLIC PANEL WITH GFR
ALT: 14 U/L (ref 0–35)
AST: 15 U/L (ref 0–37)
Albumin: 4.2 g/dL (ref 3.5–5.2)
Alkaline Phosphatase: 40 U/L (ref 39–117)
BUN: 8 mg/dL (ref 6–23)
CO2: 30 meq/L (ref 19–32)
Calcium: 8.6 mg/dL (ref 8.4–10.5)
Chloride: 103 meq/L (ref 96–112)
Creatinine, Ser: 0.74 mg/dL (ref 0.40–1.20)
GFR: 101.4 mL/min (ref >60.00)
Glucose, Bld: 72 mg/dL (ref 70–99)
Potassium: 3.5 meq/L (ref 3.5–5.1)
Sodium: 138 meq/L (ref 135–145)
Total Bilirubin: 0.5 mg/dL (ref 0.2–1.2)
Total Protein: 7 g/dL (ref 6.0–8.3)

## 2023-09-09 LAB — CBC WITH DIFFERENTIAL/PLATELET
Basophils Absolute: 0 10*3/uL (ref 0.0–0.1)
Basophils Relative: 0.5 % (ref 0.0–3.0)
Eosinophils Absolute: 0.1 10*3/uL (ref 0.0–0.7)
Eosinophils Relative: 2.4 % (ref 0.0–5.0)
HCT: 37.3 % (ref 36.0–46.0)
Hemoglobin: 12.1 g/dL (ref 12.0–15.0)
Lymphocytes Relative: 47.7 % — ABNORMAL HIGH (ref 12.0–46.0)
Lymphs Abs: 1.6 10*3/uL (ref 0.7–4.0)
MCHC: 32.5 g/dL (ref 30.0–36.0)
MCV: 82.1 fl (ref 78.0–100.0)
Monocytes Absolute: 0.3 10*3/uL (ref 0.1–1.0)
Monocytes Relative: 9.6 % (ref 3.0–12.0)
Neutro Abs: 1.4 10*3/uL (ref 1.4–7.7)
Neutrophils Relative %: 39.8 % — ABNORMAL LOW (ref 43.0–77.0)
Platelets: 226 10*3/uL (ref 150.0–400.0)
RBC: 4.54 Mil/uL (ref 3.87–5.11)
RDW: 14.1 % (ref 11.5–15.5)
WBC: 3.4 10*3/uL — ABNORMAL LOW (ref 4.0–10.5)

## 2023-09-09 LAB — FERRITIN: Ferritin: 37.1 ng/mL (ref 10.0–291.0)

## 2023-09-09 LAB — TSH: TSH: 1.54 u[IU]/mL (ref 0.35–5.50)

## 2023-09-09 LAB — VITAMIN B12: Vitamin B-12: 454 pg/mL (ref 211–911)

## 2023-09-09 MED ORDER — LEVOTHYROXINE SODIUM 88 MCG PO TABS: 88.0000 ug | ORAL_TABLET | Freq: Every day | ORAL | 3 refills | Status: AC

## 2023-09-09 MED ORDER — CYANOCOBALAMIN 1000 MCG/ML IJ SOLN
1000.0000 ug | Freq: Once | INTRAMUSCULAR | Status: AC
Start: 2023-09-09 — End: 2023-09-09
  Administered 2023-09-09: 1000 ug via INTRAMUSCULAR

## 2023-09-09 MED ORDER — AMLODIPINE BESYLATE 10 MG PO TABS: 10.0000 mg | ORAL_TABLET | Freq: Every day | ORAL | 3 refills | Status: AC

## 2023-09-09 NOTE — Progress Notes (Unsigned)
 Patient B-12Injection was given in the  {Injection site:18846}. Patient tolerated injection well.

## 2023-09-09 NOTE — Patient Instructions (Addendum)
 Go to the lab on the way out.   If you have mychart we'll likely use that to update you.    B12 dose after the labs are collected.   Tetanus shot today.  I would get a flu shot each fall.   Take care.  Glad to see you.  If lightheaded at all, then cut the amlodipine  in half.

## 2023-09-09 NOTE — Progress Notes (Unsigned)
 CPE- See plan.  Routine anticipatory guidance given to patient.  See health maintenance.  The possibility exists that previously documented standard health maintenance information may have been brought forward from a previous encounter into this note.  If needed, that same information has been updated to reflect the current situation based on today's encounter.    Tdap 2025 Flu prev done Covid prev done.   PNA prev done . Shingles not due.  RSV not due.  Pap per gyn.   DXA not due.  Mammogram per gynecology.   Colon cancer screening not due.   Advance directive d/w pt.  Mother designated if patient were incapacitated.    Diet and exercise d/w pt.    Hypertension:    Using medication without problems or lightheadedness: yes Chest pain with exertion:no Edema:no Short of breath:no Labs pending.  See AVS.   Hypothyroidism.  Compliant, early AM use, fasting.  Recheck labs pending. No neck mass.  Swallowing well.    Sickle cell trait.  B12 def, on replacement.  Occ taking iron , usually getting about 2 doses per week.  No recent episodes.    PMH and SH reviewed  Meds, vitals, and allergies reviewed.   ROS: Per HPI.  Unless specifically indicated otherwise in HPI, the patient denies:  General: fever. Eyes: acute vision changes ENT: sore throat Cardiovascular: chest pain Respiratory: SOB GI: vomiting GU: dysuria Musculoskeletal: acute back pain Derm: acute rash Neuro: acute motor dysfunction Psych: worsening mood Endocrine: polydipsia Heme: bleeding Allergy: hayfever  GEN: nad, alert and oriented HEENT: mucous membranes moist NECK: supple w/o LA CV: rrr. PULM: ctab, no inc wob ABD: soft, +bs EXT: no edema SKIN: no acute rash

## 2023-09-11 ENCOUNTER — Ambulatory Visit: Payer: Self-pay | Admitting: Family Medicine

## 2023-09-11 MED ORDER — IRON (FERROUS SULFATE) 325 (65 FE) MG PO TABS: 325.0000 mg | ORAL_TABLET | ORAL | Status: AC

## 2023-09-11 NOTE — Assessment & Plan Note (Signed)
 B12 dose given today.  See notes on labs.  Continue replacement.

## 2023-09-11 NOTE — Assessment & Plan Note (Signed)
 No recent crises.  Discussed with patient about staying well-hydrated.  Update me as needed.  See notes on labs.

## 2023-09-11 NOTE — Assessment & Plan Note (Signed)
 Has been taking iron  about twice per week.  See notes on labs.

## 2023-09-11 NOTE — Assessment & Plan Note (Signed)
 See notes on labs.  Continue amlodipine  as is.

## 2023-09-11 NOTE — Assessment & Plan Note (Signed)
 Tdap 2025 Flu prev done Covid prev done.   PNA prev done . Shingles not due.  RSV not due.  Pap per gyn.   DXA not due.  Mammogram per gynecology.   Colon cancer screening not due.   Advance directive d/w pt.  Mother designated if patient were incapacitated.    Diet and exercise d/w pt.

## 2023-09-11 NOTE — Assessment & Plan Note (Signed)
Continue levothyroxine.  See notes on labs. 

## 2023-09-11 NOTE — Assessment & Plan Note (Signed)
?

## 2023-10-11 ENCOUNTER — Ambulatory Visit (INDEPENDENT_AMBULATORY_CARE_PROVIDER_SITE_OTHER)

## 2023-10-11 DIAGNOSIS — E538 Deficiency of other specified B group vitamins: Secondary | ICD-10-CM | POA: Diagnosis not present

## 2023-10-11 MED ORDER — CYANOCOBALAMIN 1000 MCG/ML IJ SOLN
1000.0000 ug | Freq: Once | INTRAMUSCULAR | Status: AC
  Administered 2023-10-11: 1000 ug via INTRAMUSCULAR

## 2023-10-11 NOTE — Progress Notes (Signed)
 After obtaining consent, and per orders of Dr. Vallarie Gauze, injection of B12 given IM in left deltoid by Lockie Rima. Patient tolerated injection well.

## 2023-11-14 ENCOUNTER — Other Ambulatory Visit: Payer: Self-pay | Admitting: Family Medicine

## 2023-11-14 DIAGNOSIS — D649 Anemia, unspecified: Secondary | ICD-10-CM

## 2023-11-15 ENCOUNTER — Ambulatory Visit (INDEPENDENT_AMBULATORY_CARE_PROVIDER_SITE_OTHER)

## 2023-11-15 ENCOUNTER — Other Ambulatory Visit (INDEPENDENT_AMBULATORY_CARE_PROVIDER_SITE_OTHER)

## 2023-11-15 DIAGNOSIS — D649 Anemia, unspecified: Secondary | ICD-10-CM | POA: Diagnosis not present

## 2023-11-15 DIAGNOSIS — E538 Deficiency of other specified B group vitamins: Secondary | ICD-10-CM | POA: Diagnosis not present

## 2023-11-15 LAB — CBC WITH DIFFERENTIAL/PLATELET
Basophils Absolute: 0 K/uL (ref 0.0–0.1)
Basophils Relative: 0.6 % (ref 0.0–3.0)
Eosinophils Absolute: 0.2 K/uL (ref 0.0–0.7)
Eosinophils Relative: 6.1 % — ABNORMAL HIGH (ref 0.0–5.0)
HCT: 36 % (ref 36.0–46.0)
Hemoglobin: 11.6 g/dL — ABNORMAL LOW (ref 12.0–15.0)
Lymphocytes Relative: 47.9 % — ABNORMAL HIGH (ref 12.0–46.0)
Lymphs Abs: 1.7 K/uL (ref 0.7–4.0)
MCHC: 32.3 g/dL (ref 30.0–36.0)
MCV: 83 fl (ref 78.0–100.0)
Monocytes Absolute: 0.3 K/uL (ref 0.1–1.0)
Monocytes Relative: 9.2 % (ref 3.0–12.0)
Neutro Abs: 1.3 K/uL — ABNORMAL LOW (ref 1.4–7.7)
Neutrophils Relative %: 36.2 % — ABNORMAL LOW (ref 43.0–77.0)
Platelets: 231 K/uL (ref 150.0–400.0)
RBC: 4.34 Mil/uL (ref 3.87–5.11)
RDW: 13.6 % (ref 11.5–15.5)
WBC: 3.6 K/uL — ABNORMAL LOW (ref 4.0–10.5)

## 2023-11-15 LAB — VITAMIN B12: Vitamin B-12: 447 pg/mL (ref 211–911)

## 2023-11-15 LAB — FERRITIN: Ferritin: 44.7 ng/mL (ref 10.0–291.0)

## 2023-11-15 MED ORDER — CYANOCOBALAMIN 1000 MCG/ML IJ SOLN
1000.0000 ug | Freq: Once | INTRAMUSCULAR | Status: AC
  Administered 2023-11-15: 1000 ug via INTRAMUSCULAR

## 2023-11-15 NOTE — Progress Notes (Signed)
 Per orders of Dr. Crawford Givens, injection of vitamin b 12 given by Lewanda Rife in right deltoid. Patient tolerated injection well. Patient will make appointment for 1 month.

## 2023-11-16 ENCOUNTER — Ambulatory Visit: Payer: Self-pay | Admitting: Family Medicine

## 2023-12-15 ENCOUNTER — Ambulatory Visit (INDEPENDENT_AMBULATORY_CARE_PROVIDER_SITE_OTHER)

## 2023-12-15 DIAGNOSIS — E538 Deficiency of other specified B group vitamins: Secondary | ICD-10-CM | POA: Diagnosis not present

## 2023-12-15 MED ORDER — CYANOCOBALAMIN 1000 MCG/ML IJ SOLN
1000.0000 ug | Freq: Once | INTRAMUSCULAR | Status: AC
  Administered 2023-12-15: 1000 ug via INTRAMUSCULAR

## 2023-12-15 NOTE — Progress Notes (Signed)
 Per orders of Dr Arlyss Solian who is out of office and Dr. Greig Avelina lovely is in office injection of vitamin b 12 given by Laray Arenas in left deltoid. Patient tolerated injection well. Patient will make appointment for 1 month.

## 2024-01-13 ENCOUNTER — Encounter: Payer: Self-pay | Admitting: Family Medicine

## 2024-01-17 ENCOUNTER — Ambulatory Visit (INDEPENDENT_AMBULATORY_CARE_PROVIDER_SITE_OTHER)

## 2024-01-17 DIAGNOSIS — E538 Deficiency of other specified B group vitamins: Secondary | ICD-10-CM | POA: Diagnosis not present

## 2024-01-17 DIAGNOSIS — Z23 Encounter for immunization: Secondary | ICD-10-CM

## 2024-01-17 MED ORDER — CYANOCOBALAMIN 1000 MCG/ML IJ SOLN
1000.0000 ug | Freq: Once | INTRAMUSCULAR | Status: AC
  Administered 2024-01-17: 1000 ug via INTRAMUSCULAR

## 2024-01-17 NOTE — Progress Notes (Signed)
 Per orders of Dr. Arlyss Solian, injection of vitamin b 12  given by Laray Arenas in right deltoid. Patient tolerated injection well. Patient will make appointment for 1 month. And regular dose flu shot in left deltoid. Pt tolwerated inj well.

## 2024-01-19 ENCOUNTER — Other Ambulatory Visit: Payer: Self-pay | Admitting: Obstetrics and Gynecology

## 2024-01-19 DIAGNOSIS — R928 Other abnormal and inconclusive findings on diagnostic imaging of breast: Secondary | ICD-10-CM

## 2024-02-01 ENCOUNTER — Other Ambulatory Visit: Payer: Self-pay | Admitting: Obstetrics and Gynecology

## 2024-02-01 ENCOUNTER — Ambulatory Visit
Admission: RE | Admit: 2024-02-01 | Discharge: 2024-02-01 | Disposition: A | Discharge status: Home / Self Care | Source: Ambulatory Visit | Attending: Obstetrics and Gynecology | Admitting: Obstetrics and Gynecology

## 2024-02-01 DIAGNOSIS — R928 Other abnormal and inconclusive findings on diagnostic imaging of breast: Secondary | ICD-10-CM

## 2024-02-01 DIAGNOSIS — N6310 Unspecified lump in the right breast, unspecified quadrant: Secondary | ICD-10-CM

## 2024-02-07 ENCOUNTER — Ambulatory Visit
Admission: RE | Admit: 2024-02-07 | Discharge: 2024-02-07 | Disposition: A | Source: Ambulatory Visit | Attending: Obstetrics and Gynecology | Admitting: Obstetrics and Gynecology

## 2024-02-07 ENCOUNTER — Ambulatory Visit
Admission: RE | Admit: 2024-02-07 | Discharge: 2024-02-07 | Disposition: A | Discharge status: Home / Self Care | Source: Ambulatory Visit | Attending: Obstetrics and Gynecology | Admitting: Obstetrics and Gynecology

## 2024-02-07 DIAGNOSIS — N6310 Unspecified lump in the right breast, unspecified quadrant: Secondary | ICD-10-CM

## 2024-02-07 HISTORY — PX: BREAST BIOPSY: SHX20

## 2024-02-08 LAB — SURGICAL PATHOLOGY

## 2024-02-16 ENCOUNTER — Ambulatory Visit

## 2024-02-16 DIAGNOSIS — E538 Deficiency of other specified B group vitamins: Secondary | ICD-10-CM

## 2024-02-16 MED ORDER — CYANOCOBALAMIN 1000 MCG/ML IJ SOLN
1000.0000 ug | Freq: Once | INTRAMUSCULAR | Status: AC
  Administered 2024-02-16: 1000 ug via INTRAMUSCULAR

## 2024-02-16 NOTE — Progress Notes (Signed)
 Per orders of Dr. Crawford Givens, injection of vitamin b 12 given by Lewanda Rife in left deltoid. Patient tolerated injection well. Patient will make appointment for 1 month.

## 2024-03-20 ENCOUNTER — Ambulatory Visit

## 2024-03-20 DIAGNOSIS — E538 Deficiency of other specified B group vitamins: Secondary | ICD-10-CM

## 2024-03-20 MED ORDER — CYANOCOBALAMIN 1000 MCG/ML IJ SOLN
1000.0000 ug | Freq: Once | INTRAMUSCULAR | Status: AC
  Administered 2024-03-20: 1000 ug via INTRAMUSCULAR

## 2024-03-20 NOTE — Progress Notes (Signed)
 Per orders of Dr. Crawford Givens, injection of vitamin b 12 given by Lewanda Rife in right deltoid. Patient tolerated injection well. Patient will make appointment for 1 month.

## 2024-04-24 ENCOUNTER — Ambulatory Visit
